# Patient Record
Sex: Female | Born: 1950
Health system: Southern US, Community
[De-identification: ages and names within clinical notes are randomized; demographics above are authoritative.]

## PROBLEM LIST (undated history)

## (undated) ENCOUNTER — Encounter

## (undated) ENCOUNTER — Telehealth

## (undated) ENCOUNTER — Encounter: Attending: Internal Medicine | Primary: Internal Medicine

## (undated) ENCOUNTER — Ambulatory Visit: Payer: MEDICARE

## (undated) ENCOUNTER — Ambulatory Visit

## (undated) ENCOUNTER — Ambulatory Visit: Attending: Pharmacist | Primary: Pharmacist

## (undated) DIAGNOSIS — E119 Type 2 diabetes mellitus without complications: Secondary | ICD-10-CM

## (undated) DIAGNOSIS — R011 Cardiac murmur, unspecified: Secondary | ICD-10-CM

## (undated) DIAGNOSIS — I10 Essential (primary) hypertension: Secondary | ICD-10-CM

## (undated) HISTORY — DX: Type 2 diabetes mellitus without complications: E11.9

## (undated) HISTORY — PX: CARDIAC SURGERY: SHX584

## (undated) HISTORY — DX: Cardiac murmur, unspecified: R01.1

## (undated) HISTORY — PX: CORONARY STENT PLACEMENT: SHX1402

## (undated) HISTORY — PX: VEIN SURGERY: SHX48

## (undated) MED ORDER — AMLODIPINE 10 MG TABLET: 10 mg | tablet | Freq: Every day | 3 refills | 90 days | Status: CN

---
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
## 2008-02-21 ENCOUNTER — Ambulatory Visit: Payer: Self-pay | Admitting: Internal Medicine

## 2009-11-18 ENCOUNTER — Ambulatory Visit: Payer: Self-pay | Admitting: Internal Medicine

## 2009-12-04 ENCOUNTER — Ambulatory Visit: Payer: Self-pay | Admitting: Internal Medicine

## 2011-04-27 ENCOUNTER — Emergency Department: Payer: Self-pay | Admitting: Emergency Medicine

## 2011-04-27 LAB — COMPREHENSIVE METABOLIC PANEL
Albumin: 3.9 g/dL (ref 3.4–5.0)
Anion Gap: 7 (ref 7–16)
Bilirubin,Total: 0.4 mg/dL (ref 0.2–1.0)
Calcium, Total: 9.6 mg/dL (ref 8.5–10.1)
Creatinine: 0.81 mg/dL (ref 0.60–1.30)
Glucose: 93 mg/dL (ref 65–99)
Osmolality: 280 (ref 275–301)
Potassium: 4.3 mmol/L (ref 3.5–5.1)
Sodium: 139 mmol/L (ref 136–145)

## 2011-04-27 LAB — TROPONIN I: Troponin-I: 0.03 ng/mL

## 2011-04-27 LAB — PROTIME-INR: INR: 0.9

## 2011-04-27 LAB — CBC
HGB: 14.7 g/dL (ref 12.0–16.0)
MCH: 31.3 pg (ref 26.0–34.0)
MCHC: 33 g/dL (ref 32.0–36.0)
MCV: 95 fL (ref 80–100)
Platelet: 180 10*3/uL (ref 150–440)
RBC: 4.7 10*6/uL (ref 3.80–5.20)

## 2011-04-27 LAB — ETHANOL: Ethanol %: <0.003 % (ref 0.000–0.080)

## 2011-04-27 LAB — APTT: Activated PTT: 25.8 secs (ref 23.6–35.9)

## 2011-04-27 LAB — CK TOTAL AND CKMB (NOT AT ARMC)
CK, Total: 65 U/L (ref 21–215)
CK-MB: 1 ng/mL (ref 0.5–3.6)

## 2011-04-27 LAB — TSH: Thyroid Stimulating Horm: 7.95 u[IU]/mL — ABNORMAL HIGH

## 2014-04-11 ENCOUNTER — Ambulatory Visit: Payer: Self-pay | Admitting: Internal Medicine

## 2014-05-07 ENCOUNTER — Ambulatory Visit: Admit: 2014-05-07 | Disposition: A | Discharge status: Home / Self Care | Payer: Self-pay | Admitting: Internal Medicine

## 2014-05-10 LAB — BRONCHIAL WASH CULTURE

## 2014-06-07 ENCOUNTER — Emergency Department
Admit: 2014-06-07 | Disposition: A | Discharge status: Home / Self Care | Payer: Self-pay | Admitting: Emergency Medicine

## 2014-06-07 LAB — BASIC METABOLIC PANEL
ANION GAP: 8 (ref 7–16)
BUN: 21 mg/dL — AB
Calcium, Total: 9.4 mg/dL
Chloride: 107 mmol/L
Co2: 28 mmol/L
Creatinine: 0.71 mg/dL
EGFR (African American): >60
GLUCOSE: 118 mg/dL — AB
Potassium: 3.8 mmol/L
Sodium: 143 mmol/L

## 2014-06-07 LAB — CBC
HCT: 43.7 % (ref 35.0–47.0)
HGB: 14.6 g/dL (ref 12.0–16.0)
MCH: 31.4 pg (ref 26.0–34.0)
MCHC: 33.4 g/dL (ref 32.0–36.0)
MCV: 94 fL (ref 80–100)
PLATELETS: 184 10*3/uL (ref 150–440)
RBC: 4.64 10*6/uL (ref 3.80–5.20)
RDW: 13.4 % (ref 11.5–14.5)
WBC: 5.3 10*3/uL (ref 3.6–11.0)

## 2014-06-07 LAB — TROPONIN I: Troponin-I: <0.03 ng/mL

## 2014-06-07 LAB — PRO B NATRIURETIC PEPTIDE: B-TYPE NATIURETIC PEPTID: 47 pg/mL

## 2015-04-14 ENCOUNTER — Telehealth: Payer: Self-pay | Admitting: Family Medicine

## 2015-04-14 NOTE — Telephone Encounter (Signed)
Okay, as long as she did not transfer to a different primary care physician.

## 2015-04-14 NOTE — Telephone Encounter (Signed)
This lady called saying you had told Ana that you would take her back on as a patient.  Her husband currently is your patient.   She wants to know if she can schedule an re-establish care appt. With you.  Her call back is [phone],  Please leave her a message if she does not answer.  Thanks, [employer]

## 2015-04-14 NOTE — Telephone Encounter (Signed)
See below please. 

## 2015-04-14 NOTE — Telephone Encounter (Signed)
Please review, i think this is alan mitchells wife maybe. i do not recall-aa

## 2015-04-21 NOTE — Telephone Encounter (Signed)
Left pt a message to call me back.  Barth Kirks

## 2015-04-22 NOTE — Telephone Encounter (Signed)
Pt returned call. I asked if pt had transferred to another PCP and she stated that she hadn't and had only gone to a walk in clinic. Pt is scheduled for her re-establish appt on 05/14/15. Thanks TNP

## 2015-05-14 ENCOUNTER — Ambulatory Visit (INDEPENDENT_AMBULATORY_CARE_PROVIDER_SITE_OTHER): Payer: 59 | Admitting: Family Medicine

## 2015-05-14 ENCOUNTER — Encounter: Payer: Self-pay | Admitting: Family Medicine

## 2015-05-14 VITALS — BP 148/80 | HR 80 | Temp 98.6°F | Resp 14 | Ht 63.0 in | Wt 154.0 lb

## 2015-05-14 DIAGNOSIS — E039 Hypothyroidism, unspecified: Secondary | ICD-10-CM | POA: Insufficient documentation

## 2015-05-14 DIAGNOSIS — Z7189 Other specified counseling: Secondary | ICD-10-CM

## 2015-05-14 DIAGNOSIS — Z87440 Personal history of urinary (tract) infections: Secondary | ICD-10-CM | POA: Insufficient documentation

## 2015-05-14 DIAGNOSIS — R030 Elevated blood-pressure reading, without diagnosis of hypertension: Secondary | ICD-10-CM | POA: Diagnosis not present

## 2015-05-14 DIAGNOSIS — E038 Other specified hypothyroidism: Secondary | ICD-10-CM

## 2015-05-14 DIAGNOSIS — Z7689 Persons encountering health services in other specified circumstances: Secondary | ICD-10-CM

## 2015-05-14 DIAGNOSIS — Z23 Encounter for immunization: Secondary | ICD-10-CM

## 2015-05-14 NOTE — Progress Notes (Signed)
Patient ID: Sheila Herrera, female   DOB: 1950/03/05, 65 y.o.   MRN: [id number]    Subjective:  HPI  Patient is here to get established. Patient has seen Dr .Dareen Piano once in 2016 for CPE and was not pleased with that exam. She also has seen Dr. Meredeth Ide in 2016 for pneumonia she had last year in 2016. Patient has not had a doctor prior to that, she has never been good about seen someone. She does want to see a gynecologist for pap and mammogram set up. She has not had colonoscopy before and we discussed option of cologuard with patient.  Patient does not have any complaints today except for mentioning that she had recurrent urinary issues-she has had 2 UTIs so far in the past 3 months. She did not see anyone for this and just took some AZO OTC and cranberry juice. Overall patient feels well and has no complaints today. Patient very infrequently seeks medical care. Prior to Admission medications   Medication Sig Start Date End Date Taking? Authorizing Provider  Ascorbic Acid (VITAMIN C) 1000 MG tablet Take 1,000 mg by mouth daily.   Yes Historical Provider, MD  Multiple Vitamins-Minerals (AIRBORNE PO) Take by mouth.   Yes Historical Provider, MD    Patient Active Problem List   Diagnosis Date Noted  . History of recurrent UTI (urinary tract infection) 05/14/2015    No past medical history on file.  Social History   Social History  . Marital Status: Married    Spouse Name: N/A  . Number of Children: N/A  . Years of Education: N/A   Occupational History  . Not on file.   Social History Main Topics  . Smoking status: Never Smoker   . Smokeless tobacco: Never Used  . Alcohol Use: No  . Drug Use: No  . Sexual Activity: Not on file   Other Topics Concern  . Not on file   Social History Narrative  . No narrative on file    No Known Allergies  Review of Systems  Constitutional: Negative.   HENT: Negative.   Eyes: Negative.   Respiratory: Negative.   Cardiovascular:  Negative.   Gastrointestinal: Negative.   Genitourinary: Positive for frequency.  Musculoskeletal: Negative.   Neurological: Negative.   Endo/Heme/Allergies: Negative.   Psychiatric/Behavioral: Negative.      There is no immunization history on file for this patient. Objective:  BP 148/80 mmHg  Pulse 80  Temp(Src) 98.6 F (37 C)  Resp 14  Ht  (1.6 m)  Wt 154 lb (69.854 kg)  BMI 27.29 kg/m2  Physical Exam  Constitutional: She is oriented to person, place, and time and well-developed, well-nourished, and in no distress.  HENT:  Head: Normocephalic and atraumatic.  Right Ear: External ear normal.  Left Ear: External ear normal.  Nose: Nose normal.  Eyes: Conjunctivae and EOM are normal.  Neck: Neck supple. No thyromegaly present.  Cardiovascular: Normal rate, regular rhythm, normal heart sounds and intact distal pulses.   No murmur heard. Pulmonary/Chest: Effort normal and breath sounds normal. No respiratory distress. She has no wheezes.  Abdominal: Soft. Bowel sounds are normal. She exhibits no distension. There is no tenderness. There is no rebound.  Musculoskeletal: Normal range of motion. She exhibits no edema or tenderness.  Lymphadenopathy:    She has no cervical adenopathy.  Neurological: She is alert and oriented to person, place, and time. Gait normal. GCS score is 15.  Skin: Skin is warm and dry.  Psychiatric: Mood, memory, affect and judgment normal.    Lab Results  Component Value Date   WBC 5.3 06/07/2014   HGB 14.6 06/07/2014   HCT 43.7 06/07/2014   PLT 184 06/07/2014   GLUCOSE 118* 06/07/2014   TSH 7.95* 04/27/2011   INR 0.9 04/27/2011    CMP     Component Value Date/Time   NA 143 06/07/2014 0534   K 3.8 06/07/2014 0534   CL 107 06/07/2014 0534   CO2 28 06/07/2014 0534   GLUCOSE 118* 06/07/2014 0534   BUN 21* 06/07/2014 0534   CREATININE 0.71 06/07/2014 0534   CALCIUM 9.4 06/07/2014 0534   PROT 7.7 04/27/2011 0951   ALBUMIN 3.9  04/27/2011 0951   AST 26 04/27/2011 0951   ALT 29 04/27/2011 0951   ALKPHOS 74 04/27/2011 0951   BILITOT 0.4 04/27/2011 0951   GFRNONAA >60 06/07/2014 0534   GFRNONAA >60 04/27/2011 0951   GFRAA >60 06/07/2014 0534   GFRAA >60 04/27/2011 0951    Assessment and Plan :  1. Encounter to establish care Patient wants to gyn for pap, mammogram, etc. Referral put in. Advised patient to get [place] copy of her last labs through work from June 2016. - Ambulatory referral to Gynecology  2. History of recurrent UTI (urinary tract infection) Discussed hygiene with patient after sexual intercourse, and keggal exercises. May need further work up. Follow as needed.Will see patient for symptom of infection  3. Other specified hypothyroidism  4. Need for Tdap vaccination - Tdap vaccine greater than or equal to 7yo IM  5. Need for zoster vaccination - Varicella-zoster vaccine subcutaneous  6. Elevated blood pressure (not hypertension) Today B/P. Advised patient to try and check her b/p at the pharmacy or home and will re access on the next visit. I think b/p up today due to 'white coat HTN." No further work up for this today.  Patient was seen and examined by Dr. Bosie Clos and note was scribed by Samara Deist, RMA. I have done the exam and reviewed the above chart and it is accurate to the best of my knowledge.    Julieanne Manson MD [facility] 05/14/2015 2:02 PM

## 2015-06-09 ENCOUNTER — Ambulatory Visit (INDEPENDENT_AMBULATORY_CARE_PROVIDER_SITE_OTHER): Payer: 59 | Admitting: Physician Assistant

## 2015-06-09 ENCOUNTER — Encounter: Payer: Self-pay | Admitting: Physician Assistant

## 2015-06-09 VITALS — BP 140/90 | HR 97 | Temp 98.5°F | Resp 16 | Wt 157.0 lb

## 2015-06-09 DIAGNOSIS — H10023 Other mucopurulent conjunctivitis, bilateral: Secondary | ICD-10-CM | POA: Diagnosis not present

## 2015-06-09 MED ORDER — NEOMYCIN-POLYMYXIN-HC 3.5-10000-1 OP SUSP: 3.0000 [drp] | Freq: Four times a day (QID) | OPHTHALMIC | Status: DC

## 2015-06-09 NOTE — Patient Instructions (Signed)

## 2015-06-09 NOTE — Progress Notes (Signed)
Patient: Sheila Herrera Female    DOB: 1950-10-20   65 y.o.   MRN: [id number] Visit Date: 06/09/2015  Today's Provider: Margaretann Loveless, PA-C   Chief Complaint  Patient presents with  . Conjunctivitis   Subjective:    Conjunctivitis  The current episode started today. The onset was sudden. The problem has been gradually worsening. Nothing relieves the symptoms. Nothing aggravates the symptoms. Associated symptoms include eye itching, eye discharge (purulent) and eye redness. Pertinent negatives include no fever, no decreased vision, no double vision, no abdominal pain, no constipation, no diarrhea, no nausea, no vomiting, no congestion, no ear discharge, no ear pain, no headaches, no rhinorrhea, no sore throat, no muscle aches, no cough, no URI, no wheezing and no eye pain. Both eyes are affected. Symptoms onset today at 11am suddenly. Her granddaughters had pink eye last week [place]. She does have purulent drainage.     No Known Allergies Previous Medications   ASCORBIC ACID (VITAMIN C) 1000 MG TABLET    Take 1,000 mg by mouth daily.   MULTIPLE VITAMINS-MINERALS (AIRBORNE PO)    Take by mouth.    Review of Systems  Constitutional: Negative for fever and chills.  HENT: Negative for congestion, ear discharge, ear pain, postnasal drip, rhinorrhea, sinus pressure and sore throat.   Eyes: Positive for discharge (purulent), redness and itching. Negative for double vision and pain.  Respiratory: Negative for cough, chest tightness, shortness of breath and wheezing.   Cardiovascular: Negative for chest pain, palpitations and leg swelling.  Gastrointestinal: Negative for nausea, vomiting, abdominal pain, diarrhea and constipation.  Neurological: Negative for headaches.    Social History  Substance Use Topics  . Smoking status: Never Smoker   . Smokeless tobacco: Never Used  . Alcohol Use: No   Objective:   BP 140/90 mmHg   Pulse 97  Temp(Src) 98.5 F (36.9 C) (Oral)  Resp 16  Wt 157 lb (71.215 kg)  Physical Exam  Constitutional: She appears well-developed and well-nourished. No distress.  HENT:  Head: Normocephalic and atraumatic.  Right Ear: Hearing, tympanic membrane, external ear and ear canal normal.  Left Ear: Hearing, tympanic membrane, external ear and ear canal normal.  Nose: Nose normal. Right sinus exhibits no maxillary sinus tenderness and no frontal sinus tenderness. Left sinus exhibits no maxillary sinus tenderness and no frontal sinus tenderness.  Mouth/Throat: Uvula is midline, oropharynx is clear and moist and mucous membranes are normal. No oropharyngeal exudate, posterior oropharyngeal edema or posterior oropharyngeal erythema.  Eyes: EOM are normal. Pupils are equal, round, and reactive to light. Right eye exhibits discharge (yellow-green purulent). Right eye exhibits no chemosis. Left eye exhibits discharge (yellow-green purulent). Left eye exhibits no chemosis. Right conjunctiva is injected. Right conjunctiva has no hemorrhage. Left conjunctiva is injected. Left conjunctiva has no hemorrhage. No scleral icterus.  Neck: Normal range of motion. Neck supple. No tracheal deviation present. No thyromegaly present.  Cardiovascular: Normal rate, regular rhythm and normal heart sounds.  Exam reveals no gallop and no friction rub.   No murmur heard. Pulmonary/Chest: Effort normal and breath sounds normal. No stridor. No respiratory distress. She has no wheezes. She has no rales.  Lymphadenopathy:    She has no cervical adenopathy.  Skin: Skin is warm and dry. She is not diaphoretic.  Vitals reviewed.       Assessment & Plan:     1. Acute bacterial conjunctivitis, bilateral  Bacterial conjunctivitis with acute onset and purulent drainage. Will treat with cortisporin as below. Advised to practice good hand hygiene. Wipe eyes with disposable tissue from inner corner to outer corner and throw away.  Warm compresses for swelling and discomfort. Call if symptoms worsen or fail to improve.  - neomycin-polymyxin-hydrocortisone (CORTISPORIN) 3.5-10000-1 ophthalmic suspension; Place 3 drops into both eyes 4 (four) times daily.  Dispense: 7.5 mL; Refill: 0       Margaretann Loveless, PA-C  [facility]

## 2015-07-29 ENCOUNTER — Encounter: Payer: 59 | Admitting: Obstetrics and Gynecology

## 2016-01-20 DIAGNOSIS — R918 Other nonspecific abnormal finding of lung field: Secondary | ICD-10-CM | POA: Insufficient documentation

## 2016-01-20 DIAGNOSIS — I214 Non-ST elevation (NSTEMI) myocardial infarction: Secondary | ICD-10-CM | POA: Insufficient documentation

## 2016-01-21 ENCOUNTER — Encounter: Payer: Self-pay | Admitting: Family Medicine

## 2016-01-23 ENCOUNTER — Telehealth: Payer: Self-pay

## 2016-01-23 ENCOUNTER — Encounter: Payer: Self-pay | Admitting: Physician Assistant

## 2016-01-23 NOTE — Telephone Encounter (Signed)
Advised patient as below.  

## 2016-01-23 NOTE — Telephone Encounter (Signed)
Patient's husband called saying that patient went to [facility] ED for chest pain on 01/20/16, and a stent was placed the following day. He reports that they took her out of work all this week. He feels that the patient may need an additional week to recover. He reports that the doctor's at the ED told him to contact her PCP if she feels she needs more time away from work. Will you be willing to write a note for the patient for her to stay out next week? Patient has an appt scheduled with you at the end of the month. Please advise. Contact number is [phone]. Thanks!

## 2016-01-23 NOTE — Telephone Encounter (Signed)
Work note printed.  

## 2016-02-05 ENCOUNTER — Ambulatory Visit (INDEPENDENT_AMBULATORY_CARE_PROVIDER_SITE_OTHER): Payer: 59 | Admitting: Physician Assistant

## 2016-02-05 ENCOUNTER — Other Ambulatory Visit: Payer: Self-pay

## 2016-02-05 ENCOUNTER — Encounter: Payer: Self-pay | Admitting: Physician Assistant

## 2016-02-05 VITALS — BP 140/80 | HR 83 | Temp 97.7°F | Resp 16 | Wt 156.2 lb

## 2016-02-05 DIAGNOSIS — E039 Hypothyroidism, unspecified: Secondary | ICD-10-CM | POA: Diagnosis not present

## 2016-02-05 DIAGNOSIS — R911 Solitary pulmonary nodule: Secondary | ICD-10-CM | POA: Diagnosis not present

## 2016-02-05 DIAGNOSIS — I219 Acute myocardial infarction, unspecified: Secondary | ICD-10-CM | POA: Diagnosis not present

## 2016-02-05 DIAGNOSIS — I2102 ST elevation (STEMI) myocardial infarction involving left anterior descending coronary artery: Secondary | ICD-10-CM

## 2016-02-05 NOTE — Progress Notes (Signed)
Patient: Sheila Herrera Female    DOB: 1950-11-22   65 y.o.   MRN: [id number] Visit Date: 02/05/2016  Today's Provider: Margaretann Loveless, PA-C   Chief Complaint  Patient presents with  . Hospitalization Follow-up   Subjective:    HPI  Follow up Hospitalization  Patient was admitted to [facility] on 01/20/16 and discharged on 01/21/16. She was treated for NSTEMI. Treatment for this included stent was placed. She reports excellent compliance with treatment. She reports this condition is Improved. Also the CXR showed Opacity of lung. She has a follow-up appointment scheduled 02/18/16 at [facility] with the Cardiologist. She went back to work yesterday. She is planning to retire on 04/05/16. Also of note her TSH was found to be elevated. She still denies any dry skin/hair, brittle nails, weight changes, constipation or hot/cold intolerance. She does have fatigue, but none prior to MI. She also thinks the fatigue is from the metoprolol and atorvastatin. She is now taking the atorvastatin at night and reports symptoms are improving.  Also lung nodule noted on CXR in right middle lung, seemingly unchanged from chest CT from 04/2014. She was seen by pulmonology in 2016 and had bronchoscopy. Biopsy had only shown persistent pneumonia per patient.  ------------------------------------------------------------------------------------    No Known Allergies   Current Outpatient Prescriptions:  .  aspirin 81 MG chewable tablet, Chew 81 mg by mouth., Disp: , Rfl:  .  atorvastatin (LIPITOR) 80 MG tablet, Take 80 mg by mouth., Disp: , Rfl:  .  metoprolol succinate (TOPROL-XL) 25 MG 24 hr tablet, Take 12.5 mg by mouth., Disp: , Rfl:  .  prasugrel (EFFIENT) 10 MG TABS tablet, Take 10 mg by mouth., Disp: , Rfl:  .  nitroGLYCERIN (NITROSTAT) 0.4 MG SL tablet, Place 0.4 mg under the tongue., Disp: , Rfl:   Review of Systems  Constitutional: Positive for fatigue ( a little).  HENT: Negative.     Respiratory: Negative for cough, chest tightness, shortness of breath and wheezing.   Cardiovascular: Negative for chest pain, palpitations and leg swelling.  Gastrointestinal: Negative for abdominal pain.  Musculoskeletal: Negative.   Neurological: Negative.     Social History  Substance Use Topics  . Smoking status: Never Smoker  . Smokeless tobacco: Never Used  . Alcohol use No   Objective:   BP 140/80 (BP Location: Left Arm, Patient Position: Sitting, Cuff Size: Normal)   Pulse 83   Temp 97.7 F (36.5 C) (Oral)   Resp 16   Wt 156 lb 3.2 oz (70.9 kg)   BMI 27.67 kg/m   Physical Exam  Constitutional: She appears well-developed and well-nourished. No distress.  Neck: Normal range of motion. Neck supple. No JVD present. No tracheal deviation present. No thyromegaly present.  Cardiovascular: Normal rate, regular rhythm, normal heart sounds and intact distal pulses.  Exam reveals no gallop and no friction rub.   No murmur heard. Pulmonary/Chest: Effort normal and breath sounds normal. No respiratory distress. She has no wheezes. She has no rales.  Musculoskeletal: She exhibits no edema.  Lymphadenopathy:    She has no cervical adenopathy.  Skin: She is not diaphoretic.  Vitals reviewed.     Assessment & Plan:     1. Myocardial infarction involving left anterior descending (LAD) coronary artery (HCC) Improving. Follows up with cardiology on 02/18/16. Will most likely establish with a cardiologist locally following initial follow up. Continue atorvastatin, ASA, effient, and metoprolol as directed.  - CBC w/Diff/Platelet -  Comprehensive Metabolic Panel (CMET)  2. Lung nodule Previously noted on Chest CT in 04/2014 and again on most recent CXR from 01/20/16. Will get Chest CT for further work up and check for stability. - CT CHEST LIMITED W CONTRAST; Future  3. Hypothyroidism, unspecified type Previous elevation of TSH at 7. No symptoms. No history. Possible stress related.  Will check labs as below and f/u pending results.  - Thyroid Panel With TSH  4. Myocardial infarction less than 4 weeks ago See above medical treatment plan for #1. - CBC w/Diff/Platelet - Comprehensive Metabolic Panel (CMET)       Margaretann LovelessJennifer M Burnette, PA-C  [facility]

## 2016-02-05 NOTE — Patient Instructions (Signed)
Heart Attack A heart attack (myocardial infarction, MI) causes damage to the heart that cannot be fixed. A heart attack often happens when a blood clot or other blockage cuts blood flow to the heart. When this happens, certain areas of the heart begin to die. This causes the pain you feel during a heart attack. Follow these instructions at home:  Take medicine as told by your doctor. You may need medicine to: ? Keep your blood from clotting too easily. ? Control your blood pressure. ? Lower your cholesterol. ? Control abnormal heart rhythms.  Change certain behaviors as told by your doctor. This may include: ? Quitting smoking. ? Being active. ? Eating a heart-healthy diet. Ask your doctor for help with this diet. ? Keeping a healthy weight. ? Keeping your diabetes under control. ? Lessening stress. ? Limiting how much alcohol you drink. Do not take these medicines unless your doctor says that you can:  Nonsteroidal anti-inflammatory drugs (NSAIDs). These include: ? Ibuprofen. ? Naproxen. ? Celecoxib.  Vitamin supplements that have vitamin A, vitamin E, or both.  Hormone therapy that contains estrogen with or without progestin.  Get help right away if:  You have sudden chest discomfort.  You have sudden discomfort in your: ? Arms. ? Back. ? Neck. ? Jaw.  You have shortness of breath at any time.  You have sudden sweating or clammy skin.  You feel sick to your stomach (nauseous) or throw up (vomit).  You suddenly get light-headed or dizzy.  You feel your heart beating fast or skipping beats. These symptoms may be an emergency. Do not wait to see if the symptoms will go away. Get medical help right away. Call your local emergency services (911 in the [place] Do not drive yourself to the hospital. This information is not intended to replace advice given to you by your health care provider. Make sure you discuss any questions you have with your health care  provider. Document Released: 07/27/2011 Document Revised: 07/03/2015 Document Reviewed: 03/30/2013 Elsevier Interactive Patient Education  2017 Elsevier Inc.  

## 2016-02-06 LAB — CBC WITH DIFFERENTIAL/PLATELET
BASOS ABS: 0 10*3/uL (ref 0.0–0.2)
Basos: 1 %
EOS (ABSOLUTE): 0.1 10*3/uL (ref 0.0–0.4)
Eos: 3 %
Hematocrit: 39.5 % (ref 34.0–46.6)
Hemoglobin: 13.2 g/dL (ref 11.1–15.9)
IMMATURE GRANULOCYTES: 0 %
Immature Grans (Abs): 0 10*3/uL (ref 0.0–0.1)
Lymphocytes Absolute: 1.5 10*3/uL (ref 0.7–3.1)
Lymphs: 30 %
MCH: 31.4 pg (ref 26.6–33.0)
MCHC: 33.4 g/dL (ref 31.5–35.7)
MCV: 94 fL (ref 79–97)
MONOS ABS: 0.4 10*3/uL (ref 0.1–0.9)
Monocytes: 8 %
NEUTROS PCT: 58 %
Neutrophils Absolute: 2.9 10*3/uL (ref 1.4–7.0)
PLATELETS: 298 10*3/uL (ref 150–379)
RBC: 4.21 x10E6/uL (ref 3.77–5.28)
RDW: 13.5 % (ref 12.3–15.4)
WBC: 4.9 10*3/uL (ref 3.4–10.8)

## 2016-02-06 LAB — COMPREHENSIVE METABOLIC PANEL
ALT: 19 IU/L (ref 0–32)
AST: 18 IU/L (ref 0–40)
Albumin/Globulin Ratio: 1.5 (ref 1.2–2.2)
Albumin: 4.1 g/dL (ref 3.6–4.8)
Alkaline Phosphatase: 118 IU/L — ABNORMAL HIGH (ref 39–117)
BILIRUBIN TOTAL: 0.5 mg/dL (ref 0.0–1.2)
BUN/Creatinine Ratio: 17 (ref 12–28)
BUN: 14 mg/dL (ref 8–27)
CO2: 24 mmol/L (ref 18–29)
Calcium: 9.1 mg/dL (ref 8.7–10.3)
Chloride: 103 mmol/L (ref 96–106)
Creatinine, Ser: 0.82 mg/dL (ref 0.57–1.00)
GFR calc non Af Amer: 76 mL/min/{1.73_m2} (ref >59)
GFR, EST AFRICAN AMERICAN: 88 mL/min/{1.73_m2} (ref >59)
GLUCOSE: 98 mg/dL (ref 65–99)
Globulin, Total: 2.7 g/dL (ref 1.5–4.5)
POTASSIUM: 4.4 mmol/L (ref 3.5–5.2)
Sodium: 141 mmol/L (ref 134–144)
TOTAL PROTEIN: 6.8 g/dL (ref 6.0–8.5)

## 2016-02-06 LAB — THYROID PANEL WITH TSH
FREE THYROXINE INDEX: 1.7 (ref 1.2–4.9)
T3 UPTAKE RATIO: 23 % — AB (ref 24–39)
T4, Total: 7.4 ug/dL (ref 4.5–12.0)
TSH: 4.33 u[IU]/mL (ref 0.450–4.500)

## 2016-02-10 ENCOUNTER — Telehealth: Payer: Self-pay | Admitting: Physician Assistant

## 2016-02-10 DIAGNOSIS — R911 Solitary pulmonary nodule: Secondary | ICD-10-CM

## 2016-02-10 NOTE — Telephone Encounter (Signed)
Per [facility] order for CT is incorrect.Use IMG202,Thanks

## 2016-02-11 NOTE — Telephone Encounter (Signed)
CT order corrected

## 2016-02-13 ENCOUNTER — Ambulatory Visit: Admission: RE | Admit: 2016-02-13 | Payer: 59 | Source: Ambulatory Visit

## 2016-02-16 ENCOUNTER — Ambulatory Visit: Admission: RE | Admit: 2016-02-16 | Payer: 59 | Source: Ambulatory Visit

## 2016-06-10 ENCOUNTER — Ambulatory Visit (INDEPENDENT_AMBULATORY_CARE_PROVIDER_SITE_OTHER): Payer: 59 | Admitting: Physician Assistant

## 2016-06-10 ENCOUNTER — Encounter: Payer: Self-pay | Admitting: Physician Assistant

## 2016-06-10 VITALS — BP 120/70 | HR 84 | Temp 98.3°F | Resp 16 | Wt 163.8 lb

## 2016-06-10 DIAGNOSIS — N390 Urinary tract infection, site not specified: Secondary | ICD-10-CM

## 2016-06-10 LAB — POCT URINALYSIS DIPSTICK
Bilirubin, UA: NEGATIVE
Blood, UA: NEGATIVE
Glucose, UA: NEGATIVE
Ketones, UA: NEGATIVE
Leukocytes, UA: NEGATIVE
NITRITE UA: POSITIVE
PH UA: 6 (ref 5.0–8.0)
PROTEIN UA: NEGATIVE
SPEC GRAV UA: 1.025 (ref 1.010–1.025)
Urobilinogen, UA: 0.2 E.U./dL

## 2016-06-10 MED ORDER — SULFAMETHOXAZOLE-TRIMETHOPRIM 800-160 MG PO TABS: 1.0000 | ORAL_TABLET | Freq: Two times a day (BID) | ORAL | 0 refills | Status: DC

## 2016-06-10 NOTE — Patient Instructions (Signed)

## 2016-06-10 NOTE — Progress Notes (Signed)
Patient: Sheila Herrera Female    DOB: 1950-09-19   66 y.o.   MRN: [id number] Visit Date: 06/10/2016  Today's Provider: Margaretann Loveless, PA-C   Chief Complaint  Patient presents with  . Urinary Tract Infection   Subjective:    Urinary Tract Infection   This is a new problem. The current episode started 1 to 4 weeks ago (2 weeks ago). The problem occurs every urination. The problem has been gradually worsening. The quality of the pain is described as burning and stabbing. The pain is at a severity of 7/10. The pain is moderate. There has been no fever. She is sexually active. There is no history of pyelonephritis. Associated symptoms include frequency and urgency. Pertinent negatives include no chills, discharge, flank pain or hematuria. She has tried increased fluids (OTC Uristiat) for the symptoms. Her past medical history is significant for recurrent UTIs.      No Known Allergies   Current Outpatient Prescriptions:  .  aspirin 81 MG chewable tablet, Chew 81 mg by mouth., Disp: , Rfl:  .  metoprolol succinate (TOPROL-XL) 25 MG 24 hr tablet, Take 12.5 mg by mouth., Disp: , Rfl:  .  prasugrel (EFFIENT) 10 MG TABS tablet, Take 10 mg by mouth., Disp: , Rfl:  .  rosuvastatin (CRESTOR) 5 MG tablet, Take 5 mg by mouth., Disp: , Rfl:  .  nitroGLYCERIN (NITROSTAT) 0.4 MG SL tablet, Place 0.4 mg under the tongue., Disp: , Rfl:   Review of Systems  Constitutional: Positive for fatigue. Negative for chills and fever.  Respiratory: Negative for cough, chest tightness and shortness of breath.   Cardiovascular: Negative for chest pain, palpitations and leg swelling.  Gastrointestinal: Negative for abdominal pain and constipation.  Genitourinary: Positive for dysuria, frequency and urgency. Negative for flank pain and hematuria.  Musculoskeletal: Negative for back pain.    Social History  Substance Use Topics  . Smoking status: Never Smoker  . Smokeless tobacco: Never Used  .  Alcohol use No   Objective:   BP 120/70 (BP Location: Right Arm, Patient Position: Sitting, Cuff Size: Normal)   Pulse 84   Temp 98.3 F (36.8 C) (Oral)   Resp 16   Wt 163 lb 12.8 oz (74.3 kg)   BMI 29.02 kg/m    Physical Exam  Constitutional: She is oriented to person, place, and time. She appears well-developed and well-nourished. No distress.  Cardiovascular: Normal rate, regular rhythm and normal heart sounds.  Exam reveals no gallop and no friction rub.   No murmur heard. Pulmonary/Chest: Effort normal and breath sounds normal. No respiratory distress. She has no wheezes. She has no rales.  Abdominal: Soft. Normal appearance and bowel sounds are normal. She exhibits no distension and no mass. There is no hepatosplenomegaly. There is no tenderness. There is no rebound, no guarding and no CVA tenderness.  Neurological: She is alert and oriented to person, place, and time.  Skin: Skin is warm and dry. She is not diaphoretic.        Assessment & Plan:     1. Urinary tract infection without hematuria, site unspecified Patient brought in her own urine from home since she has difficulty urinating in public. UA was positive for Nitrites. Will treat with Bactrim as below. She is to continue to push fluids and may use AZO tabs for bladder spasm. She is to call if symptoms worsen. Urine culture not sent due to urine not being in sterile container  from home.  - POCT urinalysis dipstick - sulfamethoxazole-trimethoprim (BACTRIM DS,SEPTRA DS) 800-160 MG tablet; Take 1 tablet by mouth 2 (two) times daily.  Dispense: 20 tablet; Refill: 0       Margaretann Loveless, PA-C  [facility]

## 2016-10-21 ENCOUNTER — Ambulatory Visit
Admission: RE | Admit: 2016-10-21 | Discharge: 2016-10-21 | Disposition: A | Discharge status: Home / Self Care | Payer: Commercial Managed Care - PPO

## 2016-10-21 DIAGNOSIS — I240 Acute coronary thrombosis not resulting in myocardial infarction: Principal | ICD-10-CM

## 2016-10-22 MED ORDER — ROSUVASTATIN 10 MG TABLET
ORAL_TABLET | Freq: Every day | ORAL | 5 refills | 0.00000 days | Status: CP
Start: 2016-10-22 — End: 2017-03-24

## 2016-12-20 ENCOUNTER — Emergency Department
Admission: EM | Admit: 2016-12-20 | Discharge: 2016-12-20 | Disposition: A | Discharge status: Home / Self Care | Source: Intra-hospital

## 2016-12-20 ENCOUNTER — Emergency Department
Admission: EM | Admit: 2016-12-20 | Discharge: 2016-12-20 | Disposition: A | Discharge status: Home / Self Care | Source: Intra-hospital | Admitting: Emergency Medicine

## 2016-12-20 DIAGNOSIS — R079 Chest pain, unspecified: Principal | ICD-10-CM

## 2017-03-18 MED ORDER — PRASUGREL 10 MG TABLET
ORAL_TABLET | Freq: Every day | ORAL | 0 refills | 0 days | Status: CP
Start: 2017-03-18 — End: 2017-03-24

## 2017-03-24 ENCOUNTER — Ambulatory Visit: Admit: 2017-03-24 | Discharge: 2017-03-25 | Payer: MEDICARE

## 2017-03-24 DIAGNOSIS — I240 Acute coronary thrombosis not resulting in myocardial infarction: Principal | ICD-10-CM

## 2017-03-24 DIAGNOSIS — E785 Hyperlipidemia, unspecified: Secondary | ICD-10-CM

## 2017-03-24 MED ORDER — EZETIMIBE 10 MG TABLET
ORAL_TABLET | Freq: Every day | ORAL | 3 refills | 0 days | Status: CP
Start: 2017-03-24 — End: 2017-06-23

## 2017-03-24 MED ORDER — ROSUVASTATIN 5 MG TABLET
ORAL_TABLET | Freq: Every day | ORAL | 3 refills | 0.00000 days | Status: CP
Start: 2017-03-24 — End: 2017-06-23

## 2017-03-25 MED ORDER — METOPROLOL SUCCINATE ER 25 MG TABLET,EXTENDED RELEASE 24 HR
ORAL_TABLET | Freq: Every day | ORAL | 2 refills | 0 days | Status: CP
Start: 2017-03-25 — End: 2017-06-23

## 2017-06-23 ENCOUNTER — Ambulatory Visit: Admit: 2017-06-23 | Discharge: 2017-06-24 | Payer: MEDICARE

## 2017-06-23 DIAGNOSIS — I240 Acute coronary thrombosis not resulting in myocardial infarction: Principal | ICD-10-CM

## 2017-06-23 DIAGNOSIS — E785 Hyperlipidemia, unspecified: Secondary | ICD-10-CM

## 2017-06-23 DIAGNOSIS — I214 Non-ST elevation (NSTEMI) myocardial infarction: Secondary | ICD-10-CM

## 2017-12-26 MED ORDER — METOPROLOL SUCCINATE ER 25 MG TABLET,EXTENDED RELEASE 24 HR
ORAL_TABLET | Freq: Every day | ORAL | 1 refills | 0 days | Status: CP
Start: 2017-12-26 — End: 2017-12-29

## 2017-12-29 ENCOUNTER — Ambulatory Visit: Admit: 2017-12-29 | Discharge: 2017-12-29 | Payer: MEDICARE

## 2017-12-29 DIAGNOSIS — I240 Acute coronary thrombosis not resulting in myocardial infarction: Principal | ICD-10-CM

## 2017-12-29 MED ORDER — ROSUVASTATIN 5 MG TABLET
ORAL_TABLET | 6 refills | 0 days | Status: CP
Start: 2017-12-29 — End: 2017-12-30

## 2017-12-29 MED ORDER — METOPROLOL SUCCINATE ER 25 MG TABLET,EXTENDED RELEASE 24 HR
ORAL_TABLET | Freq: Every day | ORAL | 3 refills | 0.00000 days | Status: CP
Start: 2017-12-29 — End: 2017-12-29

## 2017-12-29 MED ORDER — METOPROLOL SUCCINATE ER 25 MG TABLET,EXTENDED RELEASE 24 HR: 25 mg | tablet | Freq: Every day | 3 refills | 0 days | Status: AC

## 2017-12-30 MED ORDER — ROSUVASTATIN 5 MG TABLET
ORAL_TABLET | Freq: Every day | ORAL | 1 refills | 0 days | Status: CP
Start: 2017-12-30 — End: 2018-06-22

## 2018-06-21 NOTE — Unmapped (Signed)
Patient called in preparation for phone visit with Dr.     Time spent on visit: 5 minutes    Allergies reviewed : yes    Pharmacy verified: yes    Medications reviewed: yes    Refills: 0    Screening completed: C. Marquies Wanat LPN    Questions/concerns addressed: Says that she doesn't have the stamina that she used to have.     Doximity test: Doximity ready.    Verbal General Consent: yes

## 2018-06-22 ENCOUNTER — Telehealth: Admit: 2018-06-22 | Discharge: 2018-06-23 | Payer: MEDICARE

## 2018-06-22 DIAGNOSIS — E785 Hyperlipidemia, unspecified: Secondary | ICD-10-CM

## 2018-06-22 DIAGNOSIS — I240 Acute coronary thrombosis not resulting in myocardial infarction: Principal | ICD-10-CM

## 2018-06-22 MED ORDER — REPATHA SURECLICK 140 MG/ML SUBCUTANEOUS PEN INJECTOR
SUBCUTANEOUS | 11 refills | 0 days | Status: CP
Start: 2018-06-22 — End: ?

## 2018-06-22 NOTE — Unmapped (Signed)
06/22/2018    PRIMARY CARE PROVIDER:  Bosie Clos, MD  [address]  [place] [address]    REASON FOR VISIT:  Follow-up CAD non-STEMI    I spent 23 minutes on the audio/video with the patient. I spent an additional 15 minutes on pre- and post-visit activities.     The patient was physically located in [place] or a state in which I am permitted to provide care. The patient and/or parent/gauardian understood that s/he may incur co-pays and cost sharing, and agreed to the telemedicine visit. The visit was completed via phone and/or video, which was appropriate and reasonable under the circumstances given the patient's presentation at the time.    The patient and/or parent/guardian has been advised of the potential risks and limitations of this mode of treatment (including, but not limited to, the absence of in-person examination) and has agreed to be treated using telemedicine. The patient's/patient's family's questions regarding telemedicine have been answered.     If the phone/video visit was completed in an ambulatory setting, the patient and/or parent/guardian has also been advised to contact their provider???s office for worsening conditions, and seek emergency medical treatment and/or call 911 if the patient deems either necessary.    ASSESSMENT AND PLAN:     1. Ischemic heart disease due to coronary artery obstruction (RAF-HCC)  She is not having any recurrent symptoms suggestive of ischemia.   Single-vessel disease with LAD stent on 01/20/16.  She has completed 12 months of DAPT. Now on aspirin    She retired from [employer]    She did not participate in cardiac rehabilitation but walks 2-3 miles 6 days per week and is not having any symptoms suggestive of ischemia.    --She denies any CP, SOB, dizziness.   --She has been able to walk two miles a day without any difficulty    2. Blood pressure  Her blood pressure has tended to run on the low side and she is tolerating metoprolol succinate but she has noted to elevated pressures at home 135/75 and 144/82.  She has not been checking her blood pressure as regularly so have asked her to monitor at home on a daily basis and send me the results over the next month..  Continue metoprolol to 25 mg once daily consider adding additional therapy if blood pressure sustains above 130 systolic    3. Lipids   After her non-STEMI in 2017 she attempted to take high-dose atorvastatin 80 mg.  She was unable to tolerate atorvastatin due to body aches and pains despite trying very low doses and alternating days.  We also tried rosuvastatin at low dose of 2.5 mg and she has been unable to tolerate due to muscle aches and pains.  She stated she was unable to climb the steps after taking rosuvastatin for the last time.  We also had her try rosuvastatin once per week and she was unable to tolerate even this small dose.  We had discussed PCSK9 inhibitors in the past but attempted to have her try statins 1 more time.  She has not tolerated statins on multiple attempts.  With her history of coronary artery disease, positive family history of early coronary artery disease she would be a good candidate for Repatha.  [facility].  We can follow-up her labs after she starts 2 doses.     Lab Results   Component Value Date    LDL 121 (  H) 10/21/2016      Return in about 6 months (around 12/23/2018).    No future appointments.    HISTORY OF PRESENT ILLNESS:  Denise Mcpherson is a 68 y.o. female with a past medical history of likely septal defect repair in 1958 he presented to the [facility] emergency Department on 01/20/16 with chest pain. The day prior to her presentation she experienced substernal aching chest pain with radiation to her left arm. The pain was worse with activity improved with rest she also felt nauseous. She called EMS and was taken to the ED where she received aspirin and nitroglycerin. She has a strong family history of coronary disease but no other major medical problems. She underwent left heart catheterization on 01/20/16 that showed significant 1 vessel coronary artery disease with PCI performed to a 99% LAD lesion with drug-eluting stent placement. She had excellent results. Her echocardiogram showed a normal LVEF of 60-65%, aortic sclerosis, and normal RV function. She was discharged on appropriate medications including aspirin 81 mg daily, and Effient 10 mg once daily. She was referred for cardiac rehabilitation. Since discharge she denies any recurrent chest pain, pressure, or tightness. She is not describing any symptoms consistent with volume overload including shortness of breath, lower extremity edema, orthopnea, or cough. She does have family history of coronary disease in 2 siblings and both parents. She was unable to tolerate atorvastatin but we started low-dose rosuvastatin at our last visit we attempted to increase but she did not tolerate.  She stopped taking rosuvastatin and was unable to afford Zetia.  She had no interval change in her symptoms since her last visit.  She attempted to start simvastatin once per week but was unable to tolerate.  Overall she has been doing well from a cardiac standpoint since her last visit.  She is had no chest pain, pressure, or tightness.  She has no symptoms of volume overload or arrhythmia.  She has found quarantine difficult and has noted more depression but was able to go to the beach last week and feels a bit better.    PAST MEDICAL HISTORY:  Likely septal defect repair 1958  Coronary artery disease status post DES to the LAD on 01/20/16    REVIEW OF SYSTEMS: 10 system review is negative X 10 except as stated in the HPI.     CURRENT MEDICATIONS:  Current Outpatient Medications   Medication Sig Dispense Refill   ??? amoxicillin (AMOXIL) 500 MG capsule Take 500 mg by mouth once as needed. Used prior to dental visit  1   ??? aspirin 81 MG chewable tablet Chew 1 tablet (81 mg total) daily. 30 tablet 11   ??? metoprolol succinate (TOPROL-XL) 25 MG 24 hr tablet Take 1 tablet (25 mg total) by mouth daily. 90 tablet 3   ??? evolocumab (REPATHA SURECLICK) 140 mg/mL PnIj Inject 140 mg under the skin every fourteen (14) days. 2 Syringe 11     No current facility-administered medications for this visit.        ALLERGIES:  No Known Allergies    PHYSICAL EXAMINATION: unchanged   VITAL SIGNS: There were no vitals taken for this visit.  Video visit no in person exam performed.  Documented exam below is from last in person visit.  Wt Readings from Last 3 Encounters:   12/29/17 75.8 kg (167 lb 3.2 oz)   06/23/17 74.6 kg (164 lb 6.4 oz)   03/24/17 74.6 kg (164 lb 8 oz)     GENERAL:  The patient is alert and cooperative and is in no acute distress.   RESPIRATORY:  Respirations are even and unlabored.  Clear to auscultation bilaterally.  No crackles, wheezes or rhonchi.   CARDIOVASCULAR: Regular rate and rhythm, normal S1, S2, no rubs or gallops are noted. No edema. Radial pulses, 2+ and equal.   MSK:  Grossly normal.   NEUROLOGICAL:  Alert and oriented x 3.  No focal neurological deficits.   SKIN:  Warm, dry and intact.    PSYCHIATRIC:  Mood and affect are pleasant and appropriate.     DIAGNOSTIC AND LAB RESULTS:   Lab Results   Component Value Date    Sodium 139 12/20/2016    Potassium 4.7 12/20/2016    Chloride 104 12/20/2016    CO2 22.0 12/20/2016    BUN 18 12/20/2016    Creatinine 0.74 12/20/2016    Creatinine 0.75 01/21/2016    Creatinine 0.79 01/20/2016    Glucose 106 12/20/2016    Calcium 9.6 12/20/2016    Albumin 4.2 12/20/2016    Total Protein 7.6 12/20/2016    Total Bilirubin 0.9 12/20/2016    AST 27 12/20/2016    ALT 28 12/20/2016    Alkaline Phosphatase 106 12/20/2016    Magnesium 1.9 01/21/2016    Cholesterol 192 10/21/2016    Triglycerides 105 10/21/2016    HDL 50 10/21/2016    HDL 65 (H) 01/20/2016    LDL Calculated 121 (H) 10/21/2016    LDL Calculated 134 (H) 01/20/2016    Hemoglobin A1C 5.8 01/20/2016    WBC 7.7 12/20/2016    HGB 14.3 12/20/2016    HCT 43.1 12/20/2016    MCV 92.7 12/20/2016    Platelet 231 12/20/2016        Cardiovascular History & Procedures:  Cardiovascular Problems:  ?? CAD, DES to LAD 01/20/16    Risk Factors:  ?? Age, gender, family history     Cath / PCI:  01/20/16: Findings: Coronary artery disease (as described below) including a mid LAD 99% stenosis and a distal LAD 40% stenosis. Successful PCI to the mid LAD with the placement of a Synergy drug-eluting stent with excellent angiographic result and TIMI 3 flow.    CV Surgery:  ?? 1969 - septal repair?    EP Procedures and Devices:  ?? none    Non-Invasive Evaluation(s):  ?? TTE 01/20/16: Normal left ventricular systolic function, ejection fraction 60 to 65%, Aortic sclerosis, Normal right ventricular systolic function

## 2018-06-22 NOTE — Unmapped (Signed)
Continue to monitor blood pressure at home  Will obtain labs this summer once restrictions have been lifted  We will start Repatha.  You will be contacted by [facility].

## 2018-06-22 NOTE — Unmapped (Signed)
Per test claim for REPATHA at the [facility], patient needs Medication Assistance Program for Prior Authorization.

## 2018-06-26 NOTE — Unmapped (Signed)
07/10/18 - Denise Mcpherson has decided she does not want to use a PCSK9 at this time. She expressed understanding that she could contact us if she changes her mind. Routing to provider and disenrolling at this time.

## 2018-06-26 NOTE — Unmapped (Signed)
[facility] Medication Referral: PA and Financial Assistance Approved      Medication (Brand/Generic): REPATHA    Final Test Claim completed with resulted information below:    Patient ABLE to fill at [facility]  Insurance Company:  [facility] AETNA PART D  Anticipated Copay: $0  Is anticipated copay with a copay card or grant? Yes, there was a grant approved for this patient for this medication.     Does patient's insurance plan only allow a 15 day supply for the first 6 fills in the [facility] Program? NO  If yes, inform patient they can request to dis-enroll from the [facility] by calling the patient help desk at NOT APPLICABLE.      If the copay is under the $25 defined limit, per policy there will be no further investigation of need for financial assistance at this time unless patient requests. This referral has been communicated to the provider and handed off to the [facility] team for further processing and filling of prescribed medication.   ______________________________________________________________________  Please utilize this referral for viewing purposes as it will serve as the central location for all relevant documentation and updates.

## 2018-11-11 ENCOUNTER — Emergency Department
Admit: 2018-11-11 | Discharge: 2018-11-11 | Disposition: A | Discharge status: Home / Self Care | Payer: MEDICARE | Attending: Emergency Medicine

## 2018-11-11 ENCOUNTER — Ambulatory Visit
Admit: 2018-11-11 | Discharge: 2018-11-11 | Disposition: A | Discharge status: Home / Self Care | Payer: MEDICARE | Attending: Emergency Medicine

## 2018-11-11 DIAGNOSIS — R079 Chest pain, unspecified: Secondary | ICD-10-CM

## 2018-11-11 DIAGNOSIS — Z7982 Long term (current) use of aspirin: Secondary | ICD-10-CM

## 2018-11-11 DIAGNOSIS — I10 Essential (primary) hypertension: Secondary | ICD-10-CM

## 2018-11-11 DIAGNOSIS — I252 Old myocardial infarction: Secondary | ICD-10-CM

## 2018-11-11 DIAGNOSIS — R0789 Other chest pain: Secondary | ICD-10-CM

## 2018-12-28 ENCOUNTER — Ambulatory Visit: Admit: 2018-12-28 | Discharge: 2018-12-29 | Payer: MEDICARE

## 2018-12-28 DIAGNOSIS — E785 Hyperlipidemia, unspecified: Principal | ICD-10-CM

## 2018-12-28 DIAGNOSIS — I240 Acute coronary thrombosis not resulting in myocardial infarction: Principal | ICD-10-CM

## 2018-12-28 DIAGNOSIS — I10 Essential (primary) hypertension: Principal | ICD-10-CM

## 2018-12-28 MED ORDER — HYDROCHLOROTHIAZIDE 25 MG TABLET
ORAL_TABLET | Freq: Every day | ORAL | 6 refills | 30 days | Status: CP
Start: 2018-12-28 — End: ?

## 2018-12-28 MED ORDER — ROSUVASTATIN 5 MG TABLET
ORAL_TABLET | Freq: Every day | ORAL | 6 refills | 30 days | Status: CP
Start: 2018-12-28 — End: ?

## 2019-02-15 MED ORDER — METOPROLOL SUCCINATE ER 25 MG TABLET,EXTENDED RELEASE 24 HR
ORAL_TABLET | 3 refills | 0 days | Status: CP
Start: 2019-02-15 — End: ?

## 2019-04-10 DIAGNOSIS — M5416 Radiculopathy, lumbar region: Secondary | ICD-10-CM | POA: Insufficient documentation

## 2019-06-19 MED ORDER — ROSUVASTATIN 5 MG TABLET
ORAL_TABLET | 0 refills | 0 days | Status: CP
Start: 2019-06-19 — End: ?

## 2019-07-05 MED ORDER — HYDROCHLOROTHIAZIDE 25 MG TABLET
ORAL_TABLET | 1 refills | 0.00000 days | Status: CP
Start: 2019-07-05 — End: ?

## 2019-07-23 ENCOUNTER — Ambulatory Visit: Admit: 2019-07-23 | Discharge: 2019-07-24 | Payer: MEDICARE

## 2019-07-24 DIAGNOSIS — I2510 Atherosclerotic heart disease of native coronary artery without angina pectoris: Secondary | ICD-10-CM | POA: Insufficient documentation

## 2019-07-24 DIAGNOSIS — R079 Chest pain, unspecified: Secondary | ICD-10-CM | POA: Insufficient documentation

## 2019-07-24 DIAGNOSIS — I10 Essential (primary) hypertension: Secondary | ICD-10-CM | POA: Insufficient documentation

## 2019-07-24 MED ORDER — AMLODIPINE 10 MG TABLET
ORAL_TABLET | Freq: Every day | ORAL | 3 refills | 90.00000 days | Status: CN
Start: 2019-07-24 — End: 2020-07-23

## 2019-08-15 ENCOUNTER — Ambulatory Visit: Admit: 2019-08-15 | Discharge: 2019-08-16 | Payer: MEDICARE

## 2019-08-15 DIAGNOSIS — E785 Hyperlipidemia, unspecified: Principal | ICD-10-CM

## 2019-08-15 DIAGNOSIS — I259 Chronic ischemic heart disease, unspecified: Secondary | ICD-10-CM

## 2019-08-15 DIAGNOSIS — I10 Essential (primary) hypertension: Principal | ICD-10-CM

## 2019-08-15 DIAGNOSIS — I240 Acute coronary thrombosis not resulting in myocardial infarction: Principal | ICD-10-CM

## 2019-08-15 MED ORDER — AMLODIPINE 5 MG TABLET
ORAL_TABLET | Freq: Every day | ORAL | 3 refills | 90.00000 days | Status: CP
Start: 2019-08-15 — End: ?

## 2019-08-15 MED ORDER — EVOLOCUMAB 140 MG/ML SUBCUTANEOUS PEN INJECTOR
SUBCUTANEOUS | 6 refills | 0.00000 days | Status: CP
Start: 2019-08-15 — End: ?

## 2019-08-16 MED ORDER — EVOLOCUMAB 140 MG/ML SUBCUTANEOUS PEN INJECTOR
SUBCUTANEOUS | 11 refills | 0.00000 days | Status: CP
Start: 2019-08-16 — End: ?

## 2019-09-05 DIAGNOSIS — I259 Chronic ischemic heart disease, unspecified: Principal | ICD-10-CM

## 2019-09-05 DIAGNOSIS — I240 Acute coronary thrombosis not resulting in myocardial infarction: Principal | ICD-10-CM

## 2019-09-05 MED ORDER — PRALUENT PEN 150 MG/ML SUBCUTANEOUS PEN INJECTOR
SUBCUTANEOUS | 11 refills | 28 days | Status: CP
Start: 2019-09-05 — End: ?

## 2019-10-25 ENCOUNTER — Ambulatory Visit: Admit: 2019-10-25 | Discharge: 2019-10-26 | Payer: MEDICARE

## 2019-12-28 MED ORDER — HYDROCHLOROTHIAZIDE 25 MG TABLET
ORAL_TABLET | 1 refills | 0 days | Status: CP
Start: 2019-12-28 — End: ?

## 2020-01-07 MED ORDER — METOPROLOL SUCCINATE ER 25 MG TABLET,EXTENDED RELEASE 24 HR
ORAL_TABLET | ORAL | 3 refills | 0.00000 days | Status: CP
Start: 2020-01-07 — End: ?

## 2020-01-24 MED ORDER — AMLODIPINE 10 MG TABLET
Freq: Every day | ORAL | 0.00000 days
Start: 2020-01-24 — End: ?

## 2020-02-21 ENCOUNTER — Ambulatory Visit: Admit: 2020-02-21 | Discharge: 2020-02-22 | Payer: MEDICARE

## 2020-02-21 DIAGNOSIS — I10 Essential (primary) hypertension: Principal | ICD-10-CM

## 2020-02-21 DIAGNOSIS — I240 Acute coronary thrombosis not resulting in myocardial infarction: Principal | ICD-10-CM

## 2020-02-21 DIAGNOSIS — I259 Chronic ischemic heart disease, unspecified: Principal | ICD-10-CM

## 2020-02-21 DIAGNOSIS — R600 Localized edema: Principal | ICD-10-CM

## 2020-02-21 DIAGNOSIS — E785 Hyperlipidemia, unspecified: Principal | ICD-10-CM

## 2020-02-21 MED ORDER — TELMISARTAN 20 MG TABLET
ORAL_TABLET | Freq: Every day | ORAL | 3 refills | 90 days | Status: CP
Start: 2020-02-21 — End: ?

## 2020-02-22 ENCOUNTER — Ambulatory Visit: Admit: 2020-02-22 | Discharge: 2020-02-23 | Payer: MEDICARE

## 2020-02-22 DIAGNOSIS — R600 Localized edema: Principal | ICD-10-CM

## 2020-02-22 DIAGNOSIS — M7989 Other specified soft tissue disorders: Secondary | ICD-10-CM | POA: Diagnosis not present

## 2020-03-24 ENCOUNTER — Ambulatory Visit: Admit: 2020-03-24 | Discharge: 2020-03-25 | Payer: MEDICARE

## 2020-03-24 DIAGNOSIS — I10 Essential (primary) hypertension: Principal | ICD-10-CM

## 2020-04-08 MED ORDER — VALSARTAN 80 MG TABLET
ORAL_TABLET | Freq: Every day | ORAL | 3 refills | 90.00000 days | Status: CP
Start: 2020-04-08 — End: ?

## 2020-04-23 DIAGNOSIS — H524 Presbyopia: Secondary | ICD-10-CM | POA: Diagnosis not present

## 2020-06-25 DIAGNOSIS — I240 Acute coronary thrombosis not resulting in myocardial infarction: Principal | ICD-10-CM

## 2020-06-25 DIAGNOSIS — I259 Chronic ischemic heart disease, unspecified: Principal | ICD-10-CM

## 2020-06-25 MED ORDER — PRALUENT PEN 150 MG/ML SUBCUTANEOUS PEN INJECTOR
SUBCUTANEOUS | 3 refills | 84.00000 days | Status: CP
Start: 2020-06-25 — End: ?

## 2020-06-29 MED ORDER — HYDROCHLOROTHIAZIDE 25 MG TABLET
ORAL_TABLET | 1 refills | 0 days
Start: 2020-06-29 — End: ?

## 2020-07-01 MED ORDER — HYDROCHLOROTHIAZIDE 25 MG TABLET
ORAL_TABLET | Freq: Every day | ORAL | 3 refills | 90.00000 days | Status: CP
Start: 2020-07-01 — End: ?

## 2020-07-17 DIAGNOSIS — I240 Acute coronary thrombosis not resulting in myocardial infarction: Principal | ICD-10-CM

## 2020-07-17 DIAGNOSIS — I259 Chronic ischemic heart disease, unspecified: Principal | ICD-10-CM

## 2020-07-17 MED ORDER — PRALUENT PEN 150 MG/ML SUBCUTANEOUS PEN INJECTOR
SUBCUTANEOUS | 3 refills | 84.00000 days | Status: CP
Start: 2020-07-17 — End: ?

## 2020-08-12 DIAGNOSIS — I252 Old myocardial infarction: Secondary | ICD-10-CM | POA: Diagnosis not present

## 2020-08-12 DIAGNOSIS — I2510 Atherosclerotic heart disease of native coronary artery without angina pectoris: Secondary | ICD-10-CM | POA: Diagnosis not present

## 2020-08-12 DIAGNOSIS — Z8249 Family history of ischemic heart disease and other diseases of the circulatory system: Secondary | ICD-10-CM | POA: Diagnosis not present

## 2020-08-12 DIAGNOSIS — I739 Peripheral vascular disease, unspecified: Secondary | ICD-10-CM | POA: Diagnosis not present

## 2020-08-12 DIAGNOSIS — I10 Essential (primary) hypertension: Secondary | ICD-10-CM | POA: Diagnosis not present

## 2020-08-12 DIAGNOSIS — E785 Hyperlipidemia, unspecified: Secondary | ICD-10-CM | POA: Diagnosis not present

## 2020-08-25 ENCOUNTER — Ambulatory Visit: Admit: 2020-08-25 | Discharge: 2020-08-26 | Payer: MEDICARE

## 2020-08-25 DIAGNOSIS — I240 Acute coronary thrombosis not resulting in myocardial infarction: Principal | ICD-10-CM

## 2020-08-25 DIAGNOSIS — I10 Essential (primary) hypertension: Principal | ICD-10-CM

## 2020-08-25 DIAGNOSIS — I259 Chronic ischemic heart disease, unspecified: Principal | ICD-10-CM

## 2020-08-25 DIAGNOSIS — E785 Hyperlipidemia, unspecified: Principal | ICD-10-CM

## 2020-09-14 MED ORDER — AMLODIPINE 5 MG TABLET
ORAL_TABLET | ORAL | 3 refills | 0.00000 days | Status: CP
Start: 2020-09-14 — End: ?

## 2020-10-11 MED ORDER — AMLODIPINE 10 MG TABLET
ORAL_TABLET | 7 refills | 0.00000 days
Start: 2020-10-11 — End: ?

## 2020-10-14 MED ORDER — AMLODIPINE 10 MG TABLET
ORAL_TABLET | 7 refills | 0 days
Start: 2020-10-14 — End: ?

## 2020-10-19 DIAGNOSIS — I240 Acute coronary thrombosis not resulting in myocardial infarction: Principal | ICD-10-CM

## 2020-10-19 DIAGNOSIS — I259 Chronic ischemic heart disease, unspecified: Principal | ICD-10-CM

## 2020-10-19 MED ORDER — PRALUENT PEN 150 MG/ML SUBCUTANEOUS PEN INJECTOR
SUBCUTANEOUS | 3 refills | 84 days
Start: 2020-10-19 — End: ?

## 2020-10-20 MED ORDER — PRALUENT PEN 150 MG/ML SUBCUTANEOUS PEN INJECTOR
SUBCUTANEOUS | 3 refills | 84 days | Status: CP
Start: 2020-10-20 — End: ?

## 2020-10-23 DIAGNOSIS — I259 Chronic ischemic heart disease, unspecified: Principal | ICD-10-CM

## 2020-10-23 DIAGNOSIS — I240 Acute coronary thrombosis not resulting in myocardial infarction: Principal | ICD-10-CM

## 2020-10-23 MED ORDER — PRALUENT PEN 150 MG/ML SUBCUTANEOUS PEN INJECTOR
SUBCUTANEOUS | 3 refills | 84 days
Start: 2020-10-23 — End: ?

## 2020-11-14 MED ORDER — METOPROLOL SUCCINATE ER 25 MG TABLET,EXTENDED RELEASE 24 HR
ORAL_TABLET | Freq: Every day | ORAL | 3 refills | 90 days | Status: CP
Start: 2020-11-14 — End: ?

## 2020-11-14 MED ORDER — TELMISARTAN 20 MG TABLET
ORAL_TABLET | Freq: Every day | ORAL | 3 refills | 90.00000 days | Status: CP
Start: 2020-11-14 — End: ?

## 2020-11-25 ENCOUNTER — Ambulatory Visit: Admit: 2020-11-25 | Discharge: 2020-11-26 | Payer: MEDICARE

## 2020-11-25 DIAGNOSIS — I214 Non-ST elevation (NSTEMI) myocardial infarction: Secondary | ICD-10-CM | POA: Diagnosis not present

## 2020-11-25 DIAGNOSIS — R079 Chest pain, unspecified: Secondary | ICD-10-CM | POA: Diagnosis not present

## 2020-11-25 DIAGNOSIS — E785 Hyperlipidemia, unspecified: Secondary | ICD-10-CM | POA: Diagnosis not present

## 2020-11-25 DIAGNOSIS — E119 Type 2 diabetes mellitus without complications: Secondary | ICD-10-CM | POA: Diagnosis not present

## 2020-11-25 DIAGNOSIS — I10 Essential (primary) hypertension: Secondary | ICD-10-CM | POA: Diagnosis not present

## 2020-11-25 DIAGNOSIS — I255 Ischemic cardiomyopathy: Secondary | ICD-10-CM | POA: Diagnosis not present

## 2020-11-25 DIAGNOSIS — R0789 Other chest pain: Secondary | ICD-10-CM | POA: Diagnosis not present

## 2020-11-25 DIAGNOSIS — R918 Other nonspecific abnormal finding of lung field: Secondary | ICD-10-CM | POA: Diagnosis not present

## 2020-11-25 DIAGNOSIS — R778 Other specified abnormalities of plasma proteins: Secondary | ICD-10-CM | POA: Diagnosis not present

## 2020-11-25 DIAGNOSIS — R6889 Other general symptoms and signs: Secondary | ICD-10-CM | POA: Diagnosis not present

## 2020-11-25 DIAGNOSIS — Z20822 Contact with and (suspected) exposure to covid-19: Secondary | ICD-10-CM | POA: Diagnosis not present

## 2020-11-25 DIAGNOSIS — Z23 Encounter for immunization: Secondary | ICD-10-CM | POA: Diagnosis not present

## 2020-11-25 DIAGNOSIS — Z743 Need for continuous supervision: Secondary | ICD-10-CM | POA: Diagnosis not present

## 2020-11-25 DIAGNOSIS — I2510 Atherosclerotic heart disease of native coronary artery without angina pectoris: Secondary | ICD-10-CM | POA: Diagnosis not present

## 2020-11-25 DIAGNOSIS — R404 Transient alteration of awareness: Secondary | ICD-10-CM | POA: Diagnosis not present

## 2020-11-25 DIAGNOSIS — R42 Dizziness and giddiness: Secondary | ICD-10-CM | POA: Diagnosis not present

## 2020-11-25 DIAGNOSIS — R55 Syncope and collapse: Secondary | ICD-10-CM | POA: Diagnosis not present

## 2020-11-26 DIAGNOSIS — I071 Rheumatic tricuspid insufficiency: Secondary | ICD-10-CM | POA: Diagnosis not present

## 2020-11-26 DIAGNOSIS — R55 Syncope and collapse: Secondary | ICD-10-CM | POA: Diagnosis not present

## 2020-11-26 DIAGNOSIS — I214 Non-ST elevation (NSTEMI) myocardial infarction: Secondary | ICD-10-CM | POA: Diagnosis not present

## 2020-11-26 DIAGNOSIS — R079 Chest pain, unspecified: Secondary | ICD-10-CM | POA: Diagnosis not present

## 2020-11-26 DIAGNOSIS — I2510 Atherosclerotic heart disease of native coronary artery without angina pectoris: Secondary | ICD-10-CM | POA: Diagnosis not present

## 2020-11-26 DIAGNOSIS — Z955 Presence of coronary angioplasty implant and graft: Secondary | ICD-10-CM | POA: Diagnosis not present

## 2020-11-26 DIAGNOSIS — I255 Ischemic cardiomyopathy: Secondary | ICD-10-CM | POA: Diagnosis not present

## 2020-12-01 ENCOUNTER — Other Ambulatory Visit: Payer: Self-pay

## 2020-12-01 ENCOUNTER — Ambulatory Visit (INDEPENDENT_AMBULATORY_CARE_PROVIDER_SITE_OTHER): Payer: Medicare HMO | Admitting: Family Medicine

## 2020-12-01 ENCOUNTER — Encounter: Payer: Self-pay | Admitting: Family Medicine

## 2020-12-01 VITALS — BP 128/72 | HR 79 | Resp 16 | Ht 63.0 in | Wt 174.0 lb

## 2020-12-01 DIAGNOSIS — E1165 Type 2 diabetes mellitus with hyperglycemia: Secondary | ICD-10-CM

## 2020-12-01 DIAGNOSIS — I214 Non-ST elevation (NSTEMI) myocardial infarction: Secondary | ICD-10-CM | POA: Diagnosis not present

## 2020-12-01 DIAGNOSIS — E7800 Pure hypercholesterolemia, unspecified: Secondary | ICD-10-CM | POA: Diagnosis not present

## 2020-12-01 DIAGNOSIS — I25110 Atherosclerotic heart disease of native coronary artery with unstable angina pectoris: Secondary | ICD-10-CM | POA: Diagnosis not present

## 2020-12-01 NOTE — Progress Notes (Signed)
I,April Miller,acting as a scribe for Megan Mans, MD.,have documented all relevant documentation on the behalf of Megan Mans, MD,as directed by  Megan Mans, MD while in the presence of Megan Mans, MD.   Established patient visit   Patient: Sheila Herrera   DOB: 03-10-1950   70 y.o. Female  MRN: [id number] Visit Date: 12/01/2020  Today's healthcare provider: Megan Mans, MD   Chief Complaint  Patient presents with   Hospitalization Follow-up   Subjective    HPI  70 year old patient is new to me.  Has a history of CAD and was admitted this week with non-STEMI.  Cath was stable with no intervention and recommended aggressive medical management.  Feels well.  He has never smoked.  States she had a Pneumovax done at [facility]. Follow up ER visit  Patient was seen in ER for Chest Pain on 11/25/2020. She was treated for; Chest pain with history of coronary artery disease. Treatment for this included; see notes in chart. She reports good compliance with treatment. She reports this condition is Improved.  -----------------------------------------------------------------------------------------  Patient states she is feeling much better since ER visit.     Medications: Outpatient Medications Prior to Visit  Medication Sig   Alirocumab (PRALUENT) 150 MG/ML SOAJ Inject into the skin.   amLODipine (NORVASC) 5 MG tablet Take 1 tablet by mouth daily.   aspirin 81 MG chewable tablet Chew 81 mg by mouth.   hydrochlorothiazide (HYDRODIURIL) 25 MG tablet hydrochlorothiazide 25 mg tablet  TAKE 1 TABLET BY MOUTH EVERY DAY   metoprolol succinate (TOPROL-XL) 25 MG 24 hr tablet Take 12.5 mg by mouth.   telmisartan (MICARDIS) 20 MG tablet Take 1 tablet by mouth daily.   [DISCONTINUED] nitroGLYCERIN (NITROSTAT) 0.4 MG SL tablet Place 0.4 mg under the tongue.   [DISCONTINUED] prasugrel (EFFIENT) 10 MG TABS tablet Take 10 mg by mouth.   [DISCONTINUED]  rosuvastatin (CRESTOR) 5 MG tablet Take 5 mg by mouth.   [DISCONTINUED] sulfamethoxazole-trimethoprim (BACTRIM DS,SEPTRA DS) 800-160 MG tablet Take 1 tablet by mouth 2 (two) times daily.   No facility-administered medications prior to visit.    Review of Systems  Constitutional:  Negative for appetite change, chills, fatigue and fever.  Respiratory:  Negative for chest tightness and shortness of breath.   Cardiovascular:  Negative for chest pain and palpitations.  Gastrointestinal:  Negative for abdominal pain, nausea and vomiting.  Neurological:  Negative for dizziness and weakness.       Objective    BP 128/72 (BP Location: Left Arm, Patient Position: Sitting, Cuff Size: Normal)   Pulse 79   Resp 16   Ht 5\' 3"  (1.6 m)   Wt 174 lb (78.9 kg)   SpO2 100%   BMI 30.82 kg/m  BP Readings from Last 3 Encounters:  12/01/20 128/72  06/10/16 120/70  02/05/16 140/80   Wt Readings from Last 3 Encounters:  12/01/20 174 lb (78.9 kg)  06/10/16 163 lb 12.8 oz (74.3 kg)  02/05/16 156 lb 3.2 oz (70.9 kg)      Physical Exam Vitals reviewed.  Constitutional:      General: She is not in acute distress.    Appearance: She is well-developed.  HENT:     Head: Normocephalic and atraumatic.     Right Ear: Hearing normal.     Left Ear: Hearing normal.     Nose: Nose normal.  Eyes:     General: Lids are normal. No scleral icterus.  Right eye: No discharge.        Left eye: No discharge.     Conjunctiva/sclera: Conjunctivae normal.  Cardiovascular:     Rate and Rhythm: Normal rate and regular rhythm.     Heart sounds: Normal heart sounds.  Pulmonary:     Effort: Pulmonary effort is normal. No respiratory distress.  Skin:    Findings: No lesion or rash.  Neurological:     General: No focal deficit present.     Mental Status: She is alert and oriented to person, place, and time.  Psychiatric:        Mood and Affect: Mood normal.        Speech: Speech normal.        Behavior:  Behavior normal.        Thought Content: Thought content normal.        Judgment: Judgment normal.      No results found for any visits on 12/01/20.  Assessment & Plan     1. Coronary artery disease involving native coronary artery of native heart with unstable angina pectoris (HCC) Followed by cardiology.  Patient is clinically stable and will have a maximum medical management.  She is on Effient for her lipids  2. NSTEMI (non-ST elevated myocardial infarction) (HCC) Clinically stable with no chest pain.  3. Pure hypercholesterolemia On Effient  4. Type 2 diabetes mellitus with hyperglycemia, without long-term current use of insulin (HCC) A1C was 6.9 in the hospital.  This will need follow-up and aggressive treatment going forward.   No follow-ups on file.      I, Megan Mans, MD, have reviewed all documentation for this visit. The documentation on 12/06/20 for the exam, diagnosis, procedures, and orders are all accurate and complete.    Alani Lacivita Wendelyn Breslow, MD  [facility] [phone] (phone) [phone] (fax)  [facility]

## 2020-12-08 ENCOUNTER — Ambulatory Visit: Admit: 2020-12-08 | Discharge: 2020-12-09 | Payer: MEDICARE

## 2020-12-08 DIAGNOSIS — E785 Hyperlipidemia, unspecified: Principal | ICD-10-CM

## 2020-12-08 DIAGNOSIS — I10 Essential (primary) hypertension: Principal | ICD-10-CM

## 2020-12-08 DIAGNOSIS — I240 Acute coronary thrombosis not resulting in myocardial infarction: Principal | ICD-10-CM

## 2020-12-08 DIAGNOSIS — I259 Chronic ischemic heart disease, unspecified: Principal | ICD-10-CM

## 2020-12-08 MED ORDER — NITROGLYCERIN 0.4 MG SUBLINGUAL TABLET
ORAL_TABLET | SUBLINGUAL | 3 refills | 1.00000 days | Status: CP | PRN
Start: 2020-12-08 — End: ?

## 2020-12-17 MED ORDER — AMLODIPINE 5 MG TABLET
ORAL_TABLET | Freq: Every day | ORAL | 3 refills | 90.00000 days | Status: CP
Start: 2020-12-17 — End: ?

## 2020-12-19 MED ORDER — AMLODIPINE 5 MG TABLET
ORAL_TABLET | Freq: Every day | ORAL | 3 refills | 90 days | Status: CP
Start: 2020-12-19 — End: ?

## 2020-12-29 DIAGNOSIS — I240 Acute coronary thrombosis not resulting in myocardial infarction: Principal | ICD-10-CM

## 2020-12-29 DIAGNOSIS — I259 Chronic ischemic heart disease, unspecified: Principal | ICD-10-CM

## 2020-12-29 MED ORDER — METOPROLOL SUCCINATE ER 25 MG TABLET,EXTENDED RELEASE 24 HR
ORAL_TABLET | Freq: Every day | ORAL | 3 refills | 90.00000 days | Status: CP
Start: 2020-12-29 — End: ?

## 2020-12-29 MED ORDER — TELMISARTAN 20 MG TABLET
ORAL_TABLET | Freq: Every day | ORAL | 3 refills | 90.00000 days | Status: CP
Start: 2020-12-29 — End: ?

## 2020-12-29 MED ORDER — AMLODIPINE 5 MG TABLET
ORAL_TABLET | Freq: Every day | ORAL | 3 refills | 90 days | Status: CP
Start: 2020-12-29 — End: ?

## 2020-12-29 MED ORDER — PRALUENT PEN 150 MG/ML SUBCUTANEOUS PEN INJECTOR
SUBCUTANEOUS | 3 refills | 84 days | Status: CP
Start: 2020-12-29 — End: ?

## 2020-12-29 MED ORDER — HYDROCHLOROTHIAZIDE 25 MG TABLET
ORAL_TABLET | Freq: Every day | ORAL | 3 refills | 90 days | Status: CP
Start: 2020-12-29 — End: ?

## 2021-02-08 DIAGNOSIS — R300 Dysuria: Secondary | ICD-10-CM | POA: Diagnosis not present

## 2021-02-11 ENCOUNTER — Ambulatory Visit: Payer: Self-pay

## 2021-02-11 NOTE — Telephone Encounter (Signed)
Chief Complaint: Abdominal pain/UTI Symptoms: Pain to inside and outside abdomen, urgency, pain with urination, frequency Frequency: symptoms started 3 weeks ago Pertinent Negatives: Patient denies flank pain Disposition: [] ED /[] Urgent Care (no appt availability in office) / [x] Appointment(In office/virtual)/ []  [facility]/ [] Home Care/ [] Refused Recommended Disposition /[] [place] [facility]/ []  Follow-up with PCP Additional Notes: She went to an UC on Sunday, prescribed Bactrim DS, took last pill this morning, symptoms not improved, urine culture taken at the UC. She says she will bring a urine sample tomorrow in a sterile cup she has at home, because she can't urinate on the spot. I advised to refrigerate the sample she takes preferably before she leaves home or as close to the appointment as possible, she verbalized understanding.   Walkin on Sunday, trace of leukocytes in urine. Prescribed Bactrim, Reason for Disposition  [1] Taking antibiotic > 72 hours (3 days) for UTI AND [2] painful urination or frequency not improved  Answer Assessment - Initial Assessment Questions 1. ANTIBIOTIC: "What antibiotic are you taking?" "How many times per day?"     Bactrim DS finished this morning. Prescribed BID 2. DURATION: "When was the antibiotic started?"     Sunday 02/08/21 3. MAIN SYMPTOM: "What is the main symptom you are concerned about?"     Pain outside of abdomen and inside 4. FEVER: "Do you have a fever?" If Yes, ask: "What is it, how was it measured, and when did it start?"     No 5. OTHER SYMPTOMS: "Do you have any other symptoms?" (e.g., flank pain, vaginal discharge, blood in urine)     Urgency, pain with urination  Protocols used: Urinary Tract Infection on Antibiotic Follow-up Call - [facility]

## 2021-02-12 ENCOUNTER — Ambulatory Visit (INDEPENDENT_AMBULATORY_CARE_PROVIDER_SITE_OTHER): Payer: Medicare HMO | Admitting: Family Medicine

## 2021-02-12 ENCOUNTER — Other Ambulatory Visit: Payer: Self-pay

## 2021-02-12 ENCOUNTER — Encounter: Payer: Self-pay | Admitting: Family Medicine

## 2021-02-12 VITALS — BP 126/74 | HR 76 | Resp 16 | Wt 171.2 lb

## 2021-02-12 DIAGNOSIS — N898 Other specified noninflammatory disorders of vagina: Secondary | ICD-10-CM | POA: Diagnosis not present

## 2021-02-12 DIAGNOSIS — N3001 Acute cystitis with hematuria: Secondary | ICD-10-CM | POA: Diagnosis not present

## 2021-02-12 DIAGNOSIS — R319 Hematuria, unspecified: Secondary | ICD-10-CM | POA: Diagnosis not present

## 2021-02-12 DIAGNOSIS — N3090 Cystitis, unspecified without hematuria: Secondary | ICD-10-CM | POA: Insufficient documentation

## 2021-02-12 DIAGNOSIS — N9089 Other specified noninflammatory disorders of vulva and perineum: Secondary | ICD-10-CM

## 2021-02-12 DIAGNOSIS — Z87440 Personal history of urinary (tract) infections: Secondary | ICD-10-CM | POA: Diagnosis not present

## 2021-02-12 LAB — POCT URINALYSIS DIPSTICK
Bilirubin, UA: NEGATIVE
Glucose, UA: NEGATIVE
Ketones, UA: NEGATIVE
Nitrite, UA: NEGATIVE
Protein, UA: NEGATIVE
Spec Grav, UA: 1.01 (ref 1.010–1.025)
Urobilinogen, UA: 2 E.U./dL — AB
pH, UA: 5 (ref 5.0–8.0)

## 2021-02-12 MED ORDER — CEPHALEXIN 500 MG PO CAPS: 500.0000 mg | ORAL_CAPSULE | Freq: Three times a day (TID) | ORAL | 0 refills | Status: DC

## 2021-02-12 MED ORDER — ESTRADIOL 0.5 MG PO TABS: 0.5000 mg | ORAL_TABLET | Freq: Every day | ORAL | 0 refills | Status: DC

## 2021-02-12 MED ORDER — TRIAMCINOLONE ACETONIDE 0.5 % EX OINT: 1.0000 "application " | TOPICAL_OINTMENT | Freq: Two times a day (BID) | CUTANEOUS | 0 refills | Status: DC

## 2021-02-12 NOTE — Progress Notes (Signed)
Established patient visit   Patient: Sheila Herrera   DOB: 1950-06-04   71 y.o. Female  MRN: [id number] Visit Date: 02/12/2021  Today's healthcare provider: Gwyneth Sprout, FNP   Chief Complaint  Patient presents with   Urinary Tract Infection   Subjective    Urinary Tract Infection   HPI     Urinary Tract Infection   This is a new problem.  Recent episode started in the past 7 days.  The problem has been unchanged since onset.  Severity of the pain is moderate.  The problem occurs every urination.  The patient  has been recently treated for similar symptoms (Patient was seen at urgent care 4 days ago and prescribed antibiotic ).  Abdominal Pain: Absent.  Back Pain: Absent.  Chills: Absent.  Cloudy malodorus urine: Absent.  Constipation: Absent.  Cramping: Absent.  Diarrhea: Absent.  Discharge: Absent.  Fever: Absent.  Hematuria: Absent.  Nausea: Absent.  Vomiting: Absent.      Last edited by Minette Headland, CMA on 02/12/2021  1:27 PM.        Medications: Outpatient Medications Prior to Visit  Medication Sig   Alirocumab (PRALUENT) 150 MG/ML SOAJ Inject into the skin.   amLODipine (NORVASC) 5 MG tablet Take 1 tablet by mouth daily.   aspirin 81 MG chewable tablet Chew 81 mg by mouth.   hydrochlorothiazide (HYDRODIURIL) 25 MG tablet hydrochlorothiazide 25 mg tablet  TAKE 1 TABLET BY MOUTH EVERY DAY   metoprolol succinate (TOPROL-XL) 25 MG 24 hr tablet Take 12.5 mg by mouth.   nitroGLYCERIN (NITROSTAT) 0.4 MG SL tablet    telmisartan (MICARDIS) 20 MG tablet Take 1 tablet by mouth daily.   No facility-administered medications prior to visit.    Review of Systems  Genitourinary:  Positive for dysuria.      Objective    BP 126/74    Pulse 76    Resp 16    Wt 171 lb 3.2 oz (77.7 kg)    SpO2 100%    BMI 30.33 kg/m    Physical Exam Vitals and nursing note reviewed.  Constitutional:      General: She is not in acute distress.    Appearance: Normal  appearance. She is obese. She is not ill-appearing, toxic-appearing or diaphoretic.  HENT:     Head: Normocephalic and atraumatic.  Cardiovascular:     Rate and Rhythm: Normal rate and regular rhythm.     Pulses: Normal pulses.     Heart sounds: Normal heart sounds. No murmur heard.   No friction rub. No gallop.  Pulmonary:     Effort: Pulmonary effort is normal. No respiratory distress.     Breath sounds: Normal breath sounds. No stridor. No wheezing, rhonchi or rales.  Chest:     Chest wall: No tenderness.  Abdominal:     General: Bowel sounds are normal.     Palpations: Abdomen is soft.     Tenderness: There is no abdominal tenderness. There is no right CVA tenderness, left CVA tenderness, guarding or rebound.  Genitourinary:    Comments: Exam deferred; denies complaints Does not endorse risk for STI based on behavior Musculoskeletal:        General: No swelling, tenderness, deformity or signs of injury. Normal range of motion.     Right lower leg: No edema.     Left lower leg: No edema.  Skin:    General: Skin is warm and dry.  Capillary Refill: Capillary refill takes less than 2 seconds.     Coloration: Skin is not jaundiced or pale.     Findings: No bruising, erythema, lesion or rash.  Neurological:     General: No focal deficit present.     Mental Status: She is alert and oriented to person, place, and time. Mental status is at baseline.     Cranial Nerves: No cranial nerve deficit.     Sensory: No sensory deficit.     Motor: No weakness.     Coordination: Coordination normal.  Psychiatric:        Mood and Affect: Mood normal.        Behavior: Behavior normal.        Thought Content: Thought content normal.        Judgment: Judgment normal.      Results for orders placed or performed in visit on 02/12/21  POCT urinalysis dipstick  Result Value Ref Range   Color, UA yellow    Clarity, UA clear    Glucose, UA Negative Negative   Bilirubin, UA negative     Ketones, UA negative    Spec Grav, UA 1.010 1.010 - 1.025   Blood, UA non hemolyzed small    pH, UA 5.0 5.0 - 8.0   Protein, UA Negative Negative   Urobilinogen, UA 2.0 (A) 0.2 or 1.0 E.U./dL   Nitrite, UA negative    Leukocytes, UA Large (3+) (A) Negative   Appearance     Odor      Assessment & Plan     Problem List Items Addressed This Visit       Genitourinary   Cystitis - Primary   Relevant Medications   cephALEXin (KEFLEX) 500 MG capsule   Other Relevant Orders   Urine Culture   POCT urinalysis dipstick (Completed)   Vulvovaginal dryness    Steroid cream for external area, small pea size amount       Relevant Medications   triamcinolone ointment (KENALOG) 0.5 %   estradiol (ESTRACE) 0.5 MG tablet   Acute cystitis with hematuria    Was seen outside [facility]; received Abx Continued to have s/s Did not receive Cx UA + today, plan to repeat Cx Start new Abx Discussed RFs      Relevant Medications   cephALEXin (KEFLEX) 500 MG capsule     Other   History of recurrent UTI (urinary tract infection)    Within the month; did not get results from cx      Relevant Medications   cephALEXin (KEFLEX) 500 MG capsule   Hematuria    Hemolyzed on sample      Relevant Medications   cephALEXin (KEFLEX) 500 MG capsule     Return if symptoms worsen or fail to improve.      Vonna Kotyk, FNP, have reviewed all documentation for this visit. The documentation on 02/12/21 for the exam, diagnosis, procedures, and orders are all accurate and complete.    Gwyneth Sprout, [place] [phone] (phone) [phone] (fax)  [place]

## 2021-02-12 NOTE — Assessment & Plan Note (Signed)
Steroid cream for external area, small pea size amount

## 2021-02-12 NOTE — Assessment & Plan Note (Signed)
Within the month; did not get results from cx

## 2021-02-12 NOTE — Assessment & Plan Note (Signed)
Hemolyzed on sample

## 2021-02-12 NOTE — Patient Instructions (Signed)

## 2021-02-12 NOTE — Assessment & Plan Note (Signed)
Was seen outside [facility]; received Abx Continued to have s/s Did not receive Cx UA + today, plan to repeat Cx Start new Abx Discussed RFs

## 2021-02-14 LAB — URINE CULTURE

## 2021-02-16 ENCOUNTER — Ambulatory Visit: Payer: Self-pay | Admitting: *Deleted

## 2021-02-16 ENCOUNTER — Other Ambulatory Visit: Payer: Self-pay | Admitting: Family Medicine

## 2021-02-16 DIAGNOSIS — Z87440 Personal history of urinary (tract) infections: Secondary | ICD-10-CM

## 2021-02-16 DIAGNOSIS — N3001 Acute cystitis with hematuria: Secondary | ICD-10-CM

## 2021-02-16 DIAGNOSIS — N898 Other specified noninflammatory disorders of vagina: Secondary | ICD-10-CM

## 2021-02-16 DIAGNOSIS — R319 Hematuria, unspecified: Secondary | ICD-10-CM

## 2021-02-16 NOTE — Telephone Encounter (Signed)
I didn't see anything else you wrote under her lab report in regards to her response and request for medication for burning.

## 2021-02-16 NOTE — Progress Notes (Signed)
Poor sampling for UTI; mixed flora on Culture. Are you continued to experience symptoms? Would recommend referral to urology as we discussed.  Please let us know if you have any questions.  Thank you,  Merita Norton, FNP

## 2021-02-16 NOTE — Telephone Encounter (Signed)
Advised patient as below, patient states that ointment made vulva area burn more and states that she feels that ointment is not helping. Per elise payne c/c ointment and continue estrace and keflex as prescribed. Patient became tearful on phone and states that she needs ointment for relief and is asking that we call in a different ointment. Please adivse. KW

## 2021-02-16 NOTE — Telephone Encounter (Signed)
Reason for Disposition  [1] Painful urination AND [2] EITHER frequency or urgency AND [3] has on-call doctor  Answer Assessment - Initial Assessment Questions 1. SEVERITY: "How bad is the pain?"  (e.g., Scale 1-10; mild, moderate, or severe)   - MILD (1-3): complains slightly about urination hurting   - MODERATE (4-7): interferes with normal activities     - SEVERE (8-10): excruciating, unwilling or unable to urinate because of the pain      Seen on 02/12/2021 for UTI.   Meds given.   I'm still burning real bad.   The cream is not working.  When pee it's not as bad.   Surrounding area down there is so sore and burning.   2. FREQUENCY: "How many times have you had painful urination today?"      *No Answer* 3. PATTERN: "Is pain present every time you urinate or just sometimes?"      *No Answer* 4. ONSET: "When did the painful urination start?"      *No Answer* 5. FEVER: "Do you have a fever?" If Yes, ask: "What is your temperature, how was it measured, and when did it start?"     *No Answer* 6. PAST UTI: "Have you had a urine infection before?" If Yes, ask: "When was the last time?" and "What happened that time?"      *No Answer* 7. CAUSE: "What do you think is causing the painful urination?"  (e.g., UTI, scratch, Herpes sore)     *No Answer* 8. OTHER SYMPTOMS: "Do you have any other symptoms?" (e.g., flank pain, vaginal discharge, genital sores, urgency, blood in urine)     *No Answer* 9. PREGNANCY: "Is there any chance you are pregnant?" "When was your last menstrual period?"     *No Answer*  Protocols used: Urination Pain - Female-A-AH  Chief Complaint: Seen on 02/12/2021 by Merita Norton.   Prescribed antibiotics.   UTI symptoms are much better but very sore, burning and irritated around her bladder on the outside area Symptoms: burning all the time, sore, irritated Frequency: all the time Pertinent Negatives: Patient denies difficulty urinating.   Disposition: [] ED /[] Urgent Care (no  appt availability in office) / [] Appointment(In office/virtual)/ []  [facility] [facility]/ [] Home Care/ [] Refused Recommended Disposition /[] [facility]/ [x]  Follow-up with PCP Additional Notes: The steroid cream she was given is not working.   She is wondering if she is allergic to it.  Can something else be called in?  Other symptoms from the UTI are much better.  She would like a call back.

## 2021-02-17 ENCOUNTER — Other Ambulatory Visit: Payer: Self-pay | Admitting: Family Medicine

## 2021-02-17 DIAGNOSIS — Z87440 Personal history of urinary (tract) infections: Secondary | ICD-10-CM

## 2021-02-17 DIAGNOSIS — R102 Pelvic and perineal pain: Secondary | ICD-10-CM

## 2021-02-18 ENCOUNTER — Other Ambulatory Visit: Payer: Self-pay

## 2021-02-18 ENCOUNTER — Ambulatory Visit: Payer: Medicare HMO | Admitting: Obstetrics and Gynecology

## 2021-02-18 ENCOUNTER — Encounter: Payer: Self-pay | Admitting: Obstetrics and Gynecology

## 2021-02-18 VITALS — BP 144/70 | Ht 65.0 in | Wt 172.0 lb

## 2021-02-18 DIAGNOSIS — N949 Unspecified condition associated with female genital organs and menstrual cycle: Secondary | ICD-10-CM

## 2021-02-18 DIAGNOSIS — Z1231 Encounter for screening mammogram for malignant neoplasm of breast: Secondary | ICD-10-CM

## 2021-02-18 DIAGNOSIS — R300 Dysuria: Secondary | ICD-10-CM

## 2021-02-18 NOTE — Patient Instructions (Signed)
I value your feedback and you entrusting [place] with your care. If you get a [facility] patient survey, I would appreciate you taking the time to let us know about your experience today. Thank you!  HEALTHY VAGINAL HYGIENE  AVOID   Panytyhose Synthetic underwear (wear COTTON underwear)  Tight pants/jeans Thongs Pantyliners Scented soaps/shower gels (use Dove Sensitive Skin soap or water to clean) Bubble bath/bath bombs Scented detergents  ALL dryer sheets (line dry underwear if using them on your other clothing) Feminine sprays/douches

## 2021-02-18 NOTE — Telephone Encounter (Signed)
Patient has appointment today with GYN and also is scheduled to see the Urologist.

## 2021-02-18 NOTE — Progress Notes (Signed)
Mikey Kirschner, PA-C   Chief Complaint  Patient presents with   Vaginal Irritation    No discharge, odor or itchiness    HPI:      [name] is a 71 y.o. G2P2 whose LMP was No LMP recorded. Patient is postmenopausal., presents today for NP eval of vaginal burning that started about 3 wks ago, no d/c, or odor; referred by PCP. Hurt to walk and sit. Pt thought UTI due to dysuria/burning (no other UTI sx) and was treated with bactrim. Sx persisted so went to PCP and was given keflex while waiting for repeat C&S. C&S negative. Pt also given triamcinolone oint which caused extra vaginal burning, so pt stopped using it. Was given oral estradiol 0.5 mg to take daily and has been doing so for about a week. Pt states burning/dysuria sx are improving; still no other UTI sx. Had been using new feminine wipes prior to sx but has since stopped them. Also changed soaps before sx so has gone back to [employer]. Not using dryer sheets, not wearing pads.  No PMB. Pt is sex active, but hasn't been since these sx. Does have some vag dryness during sex, improved with lubricants.  Pap no longer indicated. 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    Family History  Problem Relation Age of Onset   Cancer Father        lung   Arthritis Mother    Heart disease Sister        quadriple by pass   Heart disease Brother    OCD Son     Social History   Socioeconomic  History   Marital status: Married    Spouse name: Not on file   Number of children: Not on file   Years of education: Not on file   Highest education level: Not on file  Occupational History   Not on file  Tobacco Use   Smoking status: Never   Smokeless tobacco: Never  Vaping Use   Vaping Use: Never used  Substance and Sexual Activity   Alcohol use: No   Drug use: No   Sexual activity: Yes    Birth control/protection: Post-menopausal  Other Topics Concern   Not on file  Social History Narrative   Not on file   Social Determinants of Health   Financial Resource Strain: Not on file  Food Insecurity: Not on file  Transportation Needs: Not on file  Physical Activity: Not on file  Stress: Not on file  Social Connections: Not on file  Intimate Partner Violence: Not on file    Outpatient Medications Prior to Visit  Medication Sig Dispense Refill   Alirocumab (PRALUENT) 150 MG/ML SOAJ Inject into the skin.     amLODipine (NORVASC) 5 MG tablet Take 1 tablet by mouth daily.     aspirin 81 MG chewable tablet Chew 81 mg by mouth.  cephALEXin (KEFLEX) 500 MG capsule Take 1 capsule (500 mg total) by mouth 3 (three) times daily. 6am, 2p, 10pm 30 capsule 0   hydrochlorothiazide (HYDRODIURIL) 25 MG tablet hydrochlorothiazide 25 mg tablet  TAKE 1 TABLET BY MOUTH EVERY DAY     metoprolol succinate (TOPROL-XL) 25 MG 24 hr tablet Take 12.5 mg by mouth.     nitroGLYCERIN (NITROSTAT) 0.4 MG SL tablet      telmisartan (MICARDIS) 20 MG tablet Take 1 tablet by mouth daily.     estradiol (ESTRACE) 0.5 MG tablet Take 1 tablet (0.5 mg total) by mouth daily. 30 tablet 0   triamcinolone ointment (KENALOG) 0.5 % Apply 1 application topically 2 (two) times daily. (Patient not taking: Reported on 02/18/2021) 30 g 0   No facility-administered medications prior to visit.      ROS:  Review of Systems  Constitutional:  Negative for fever.  Gastrointestinal:  Negative for blood in stool,  constipation, diarrhea, nausea and vomiting.  Genitourinary:  Positive for dysuria and vaginal pain. Negative for dyspareunia, flank pain, frequency, hematuria, urgency, vaginal bleeding and vaginal discharge.  Musculoskeletal:  Negative for back pain.  Skin:  Negative for rash.  BREAST: No symptoms   OBJECTIVE:   Vitals:  BP (!) 144/70    Ht 5\' 5"  (1.651 m)    Wt 172 lb (78 kg)    BMI 28.62 kg/m   Physical Exam Vitals reviewed.  Constitutional:      Appearance: She is well-developed.  Pulmonary:     Effort: Pulmonary effort is normal.  Genitourinary:    General: Normal vulva.     Pubic Area: No rash.      Labia:        Right: Tenderness present. No rash or lesion.        Left: No rash, tenderness or lesion.      Vagina: Normal. No vaginal discharge, erythema or tenderness.     Cervix: Normal.     Uterus: Normal. Not enlarged and not tender.      Adnexa: Right adnexa normal and left adnexa normal.       Right: No mass or tenderness.         Left: No mass or tenderness.      Musculoskeletal:        General: Normal range of motion.     Cervical back: Normal range of motion.  Skin:    General: Skin is warm and dry.  Neurological:     General: No focal deficit present.     Mental Status: She is alert and oriented to person, place, and time.  Psychiatric:        Mood and Affect: Mood normal.        Behavior: Behavior normal.        Thought Content: Thought content normal.        Judgment: Judgment normal.    Results: Results for orders placed or performed in visit on 02/18/21 (from the past 24 hour(s))  POCT Wet Prep with KOH     Status: Normal   Collection Time: 02/19/21 12:00 PM  Result Value Ref Range   Trichomonas, UA Negative    Clue Cells Wet Prep HPF POC neg    Epithelial Wet Prep HPF POC     Yeast Wet Prep HPF POC neg    Bacteria Wet Prep HPF POC     RBC Wet Prep HPF POC     WBC Wet Prep HPF POC  KOH Prep POC Negative Negative  POCT Urinalysis  Dipstick     Status: Normal   Collection Time: 02/19/21 12:00 PM  Result Value Ref Range   Color, UA yellow    Clarity, UA clear    Glucose, UA Negative Negative   Bilirubin, UA neg    Ketones, UA neg    Spec Grav, UA 1.025 1.010 - 1.025   Blood, UA neg    pH, UA 5.0 5.0 - 8.0   Protein, UA Negative Negative   Urobilinogen, UA     Nitrite, UA neg    Leukocytes, UA Negative Negative   Appearance     Odor       Assessment/Plan: Vaginal burning - Plan: NuSwab Vaginitis (VG), POCT Wet Prep with KOH; neg exam, neg wet prep. Check vag culture. Will f/u with results. Sx most likely due to chem derm due to feminine wipes; sx improving with cessation of wipes. 1 sample prem vag crm to use pea size amt externally QHS for 1 wk. D/C oral estradiol. If culture neg and sx resolved, f/u prn.   Dysuria - Plan: POCT Urinalysis Dipstick; neg UA. Given sx hx, doubt UTI all along and sx vag related  Encounter for screening mammogram for malignant neoplasm of breast - Plan: MM 3D SCREEN BREAST BILATERAL; pt to [facility] since past due.     Meds ordered this encounter  Medications   conjugated estrogens (PREMARIN) vaginal cream    Sig: AAA externally vaginally QHS for 1 wk. 1-4 g SAMPLE GIVEN    Dispense:  30 g    Refill:  0    Order Specific Question:   Supervising Provider    Answer:   Gae Dry [id number]      Return if symptoms worsen or fail to improve.  Kristoffer Bala B. Branon Sabine, PA-C 02/19/2021 12:02 PM

## 2021-02-18 NOTE — Progress Notes (Signed)
See other telephone encounter.

## 2021-02-19 LAB — POCT URINALYSIS DIPSTICK
Bilirubin, UA: NEGATIVE
Blood, UA: NEGATIVE
Glucose, UA: NEGATIVE
Ketones, UA: NEGATIVE
Leukocytes, UA: NEGATIVE
Nitrite, UA: NEGATIVE
Protein, UA: NEGATIVE
Spec Grav, UA: 1.025 (ref 1.010–1.025)
pH, UA: 5 (ref 5.0–8.0)

## 2021-02-19 LAB — POCT WET PREP WITH KOH
Clue Cells Wet Prep HPF POC: NEGATIVE
KOH Prep POC: NEGATIVE
Trichomonas, UA: NEGATIVE
Yeast Wet Prep HPF POC: NEGATIVE

## 2021-02-19 MED ORDER — PREMARIN 0.625 MG/GM VA CREA: TOPICAL_CREAM | VAGINAL | 0 refills | Status: DC

## 2021-02-21 LAB — NUSWAB VAGINITIS (VG)
Candida albicans, NAA: NEGATIVE
Candida glabrata, NAA: NEGATIVE
Trich vag by NAA: NEGATIVE

## 2021-02-22 NOTE — Progress Notes (Signed)
Pls let pt know culture neg for any vaginal infection. Sx should cont to improve. F/u prn. Thx

## 2021-02-23 NOTE — Progress Notes (Signed)
Pt aware. Did say her sx have improved.

## 2021-03-11 ENCOUNTER — Encounter: Payer: Self-pay | Admitting: Urology

## 2021-03-11 ENCOUNTER — Other Ambulatory Visit: Payer: Self-pay

## 2021-03-11 ENCOUNTER — Ambulatory Visit: Payer: Medicare HMO | Admitting: Urology

## 2021-03-11 VITALS — BP 153/104 | HR 85 | Ht 64.0 in | Wt 170.0 lb

## 2021-03-11 DIAGNOSIS — R102 Pelvic and perineal pain: Secondary | ICD-10-CM | POA: Diagnosis not present

## 2021-03-11 DIAGNOSIS — N390 Urinary tract infection, site not specified: Secondary | ICD-10-CM | POA: Diagnosis not present

## 2021-03-11 MED ORDER — ESTRADIOL 0.1 MG/GM VA CREA: TOPICAL_CREAM | VAGINAL | 12 refills | Status: DC

## 2021-03-11 NOTE — Progress Notes (Signed)
° °  03/11/21 1:55 PM   Sheila Herrera 1950-09-26 [id number]  CC: Vaginal burning  HPI: 71 year old female referred for evaluation of vaginal burning.  She denies any dysuria or any urinary symptoms.  Denies any incontinence.  No recent UTIs, recent urine culture negative.  She had a GYN exam 3 weeks ago which was benign, and she was started on topical estrogen cream and given samples.  She denies any significant improvement in the vaginal pain since then.  Sex is painful.  She denies any gross hematuria.  Symptoms seem to be worse when moving around and physically active.  Urinalysis today contaminated with greater than 10 squamous cells, 6-10 WBCs, 0-2 RBCs, many bacteria, no yeast, nitrite negative, 1+ leukocytes.    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 She has never used smokeless tobacco. She reports that she does not drink alcohol and does not use drugs.  Physical Exam: BP (!) 153/104    Pulse 85    Ht 5\' 4"  (1.626 m)    Wt 170 lb (77.1 kg)    BMI 29.18 kg/m    Constitutional:  Alert and oriented, No acute distress. Cardiovascular: No clubbing, cyanosis, or edema. Respiratory: Normal respiratory effort, no increased work of breathing. GI: Abdomen is soft, nontender, nondistended, no abdominal masses   Assessment & Plan:   71 year old female with vaginal pain likely secondary to vaginal atrophy/genitourinary syndrome of menopause.  Patient information provided and I recommended continuing the topical estrogen cream and a prescription was provided.  We also discussed the  use of lubricants.  RTC with PA 6 weeks symptom check.  She denies any urinary symptoms or dysuria, and isolated chronic vaginal pain is outside the scope of my practice as a urologist, and she may benefit from referral to a different gynecologist if persistent symptoms despite topical estrogen cream over the next few months  Prescription for topical estrogen cream RTC 6 weeks symptom check  Nickolas Madrid, MD 03/11/2021  [place] [address] [place], [place] [address] [phone]

## 2021-03-11 NOTE — Patient Instructions (Signed)
°  Vaginal atrophy (atrophic vaginitis)(genitourinary syndrome of menopause)  is thinning, drying and inflammation of the vaginal walls that may occur when your body has less estrogen. Vaginal atrophy occurs most often after menopause.  For many women, vaginal atrophy not only makes intercourse painful but also leads to distressing urinary symptoms. Because the condition causes both vaginal and urinary symptoms, doctors use the term "genitourinary syndrome of menopause (GSM)" to describe vaginal atrophy and its accompanying symptoms.  Simple, effective treatments for GSM are available. Reduced estrogen levels result in changes to your body, but it doesn't mean you have to live with the discomfort of GSM.  Symptoms Genitourinary syndrome of menopause (GSM) signs and symptoms may include:  Vaginal dryness Vaginal burning Vaginal discharge Genital itching Burning with urination Urgency with urination Frequent urination Recurrent urinary tract infections Urinary incontinence Light bleeding after intercourse Discomfort with intercourse Decreased vaginal lubrication during sexual activity Shortening and tightening of the vaginal canal   Many postmenopausal women experience GSM. But few seek treatment. Women may be embarrassed to discuss their symptoms with their doctor and may resign themselves to living with these symptoms.  Also make an appointment to see your doctor if you experience painful intercourse that's not resolved by using a vaginal moisturizer (K-Y Liquibeads, Replens, Sliquid, others) or water-based lubricant (Astroglide, K-Y Jelly, Sliquid, others).  The best treatment is topical estrogen cream, but this may take a few weeks to month to start working

## 2021-03-12 LAB — URINALYSIS, COMPLETE
Bilirubin, UA: NEGATIVE
Glucose, UA: NEGATIVE
Nitrite, UA: NEGATIVE
Specific Gravity, UA: >1.03 — ABNORMAL HIGH (ref 1.005–1.030)
Urobilinogen, Ur: 1 mg/dL (ref 0.2–1.0)
pH, UA: 5.5 (ref 5.0–7.5)

## 2021-03-12 LAB — MICROSCOPIC EXAMINATION: Epithelial Cells (non renal): >10 /hpf — AB (ref 0–10)

## 2021-03-16 DIAGNOSIS — I240 Acute coronary thrombosis not resulting in myocardial infarction: Principal | ICD-10-CM

## 2021-03-16 DIAGNOSIS — I259 Chronic ischemic heart disease, unspecified: Principal | ICD-10-CM

## 2021-03-16 MED ORDER — PRALUENT PEN 150 MG/ML SUBCUTANEOUS PEN INJECTOR
SUBCUTANEOUS | 3 refills | 84 days | Status: CP
Start: 2021-03-16 — End: ?

## 2021-03-30 ENCOUNTER — Other Ambulatory Visit: Payer: Self-pay | Admitting: Family Medicine

## 2021-03-30 DIAGNOSIS — N9089 Other specified noninflammatory disorders of vulva and perineum: Secondary | ICD-10-CM

## 2021-03-30 DIAGNOSIS — N898 Other specified noninflammatory disorders of vagina: Secondary | ICD-10-CM

## 2021-03-31 NOTE — Telephone Encounter (Signed)
Not on current med list. Requested Prescriptions  Pending Prescriptions Disp Refills   estradiol (ESTRACE) 0.5 MG tablet [Pharmacy Med Name: ESTRADIOL 0.5 MG TABLET] 30 tablet 0    Sig: TAKE 1 TABLET BY MOUTH EVERY DAY     OB/GYN:  Estrogens Failed - 03/30/2021 10:52 AM      Failed - Mammogram is up-to-date per Health Maintenance      Failed - Last BP in normal range    BP Readings from Last 1 Encounters:  03/11/21 (!) 153/104         Passed - Valid encounter within last 12 months    Recent Outpatient Visits          1 month ago [place] Tally Joe T, FNP   4 months ago Coronary artery disease involving native coronary artery of native heart with unstable angina pectoris [facility])   [facility] Jerrol Banana., MD   4 years ago Urinary tract infection without hematuria, site unspecified   [facility], [place], [place]   5 years ago Myocardial infarction involving left anterior descending (LAD) coronary artery [facility])   [facility] [place], [place], [place]   5 years ago Acute bacterial conjunctivitis, bilateral   [facility] [place], [place], [place]      Future Appointments            In 2 months Drubel, Ria Comment, PA-C [employer], [facility]            triamcinolone ointment (KENALOG) 0.5 % [Pharmacy Med Name: TRIAMCINOLONE 0.5% OINTMENT] 30 g 0    Sig: APPLY TO AFFECTED AREA TWICE A DAY     Not Delegated - Dermatology:  Corticosteroids Failed - 03/30/2021 10:52 AM      Failed - This refill cannot be delegated      Passed - Valid encounter within last 12 months    Recent Outpatient Visits          1 month ago [place] Tally Joe T, FNP   4 months ago Coronary artery disease involving native coronary artery of native heart with unstable angina pectoris [facility])   [facility] Jerrol Banana., MD   4 years  ago Urinary tract infection without hematuria, site unspecified   [facility], [place], [place]   5 years ago Myocardial infarction involving left anterior descending (LAD) coronary artery [facility])   [facility] [place], [place], [place]   5 years ago Acute bacterial conjunctivitis, bilateral   [facility] [place], [place], [place]      Future Appointments            In 2 months Thedore Mins, Ria Comment, PA-C [employer], [place]

## 2021-03-31 NOTE — Telephone Encounter (Signed)
Requested medication (s) are due for refill today: no  Requested medication (s) are on the active medication list: no  Future visit scheduled: 05/2421  Notes to clinic:  Med is not on current list, however, it is a not delegated med, please assess.      Requested Prescriptions  Pending Prescriptions Disp Refills   triamcinolone ointment (KENALOG) 0.5 % [Pharmacy Med Name: TRIAMCINOLONE 0.5% OINTMENT] 30 g 0    Sig: APPLY TO AFFECTED AREA TWICE A DAY     Not Delegated - Dermatology:  Corticosteroids Failed - 03/30/2021 10:52 AM      Failed - This refill cannot be delegated      Passed - Valid encounter within last 12 months    Recent Outpatient Visits           1 month ago [place] Tally Joe T, FNP   4 months ago Coronary artery disease involving native coronary artery of native heart with unstable angina pectoris [facility])   [facility] Jerrol Banana., MD   4 years ago Urinary tract infection without hematuria, site unspecified   [facility], [place], PA-C   5 years ago Myocardial infarction involving left anterior descending (LAD) coronary artery [facility])   [facility] [place], [place], [place]   5 years ago Acute bacterial conjunctivitis, bilateral   [facility] [place], [place], [place]       Future Appointments             In 2 months Drubel, Ria Comment, PA-C [employer], [facility]            Refused Prescriptions Disp Refills   estradiol (ESTRACE) 0.5 MG tablet [employer] Med Name: ESTRADIOL 0.5 MG TABLET] 30 tablet 0    Sig: TAKE 1 TABLET BY MOUTH EVERY DAY     OB/GYN:  Estrogens Failed - 03/30/2021 10:52 AM      Failed - Mammogram is up-to-date per Health Maintenance      Failed - Last BP in normal range    BP Readings from Last 1 Encounters:  03/11/21 (!) 153/104          Passed - Valid encounter within last 12 months    Recent Outpatient  Visits           1 month ago [place] Tally Joe T, FNP   4 months ago Coronary artery disease involving native coronary artery of native heart with unstable angina pectoris [facility])   [facility] Jerrol Banana., MD   4 years ago Urinary tract infection without hematuria, site unspecified   [facility], [place], [place]   5 years ago Myocardial infarction involving left anterior descending (LAD) coronary artery [facility])   [facility] [place], [place], [place]   5 years ago Acute bacterial conjunctivitis, bilateral   [facility] [place], [place], [place]       Future Appointments             In 2 months Thedore Mins, Ria Comment, PA-C [employer], [place]

## 2021-04-22 ENCOUNTER — Other Ambulatory Visit: Payer: Self-pay

## 2021-04-22 ENCOUNTER — Ambulatory Visit: Payer: Medicare HMO | Admitting: Urology

## 2021-04-22 ENCOUNTER — Encounter: Payer: Self-pay | Admitting: Urology

## 2021-04-22 VITALS — BP 118/70 | HR 73 | Ht 64.0 in | Wt 170.0 lb

## 2021-04-22 DIAGNOSIS — N952 Postmenopausal atrophic vaginitis: Secondary | ICD-10-CM

## 2021-04-27 DIAGNOSIS — H524 Presbyopia: Secondary | ICD-10-CM | POA: Diagnosis not present

## 2021-05-01 NOTE — Progress Notes (Signed)
? ? ?04/22/2021 ?9:55 AM  ? ?Sheila Herrera ?1951-01-23 ?[id number] ? ?Referring provider: Mikey Kirschner, PA-C ?[id number] ?[place],  [place] [address] ? ?Chief Complaint  ?Patient presents with  ? Follow-up  ? ? ?HPI: ?Sheila Herrera is a 71 y.o. female who presents today for recheck on vaginal pain after starting vaginal estrogen cream. ? ?She was seen on 03/11/2021 by Dr. Diamantina Providence for issues of vaginal pain.  She was instructed to continue the vaginal estrogen cream nightly and to follow up in 6 weeks with me. ? ?She has been applying the vaginal estrogen cream nightly and this has relieved her vaginal burning.  ? ?Patient denies any modifying or aggravating factors.  Patient denies any gross hematuria, dysuria or suprapubic/flank pain.  Patient denies any fevers, chills, nausea or vomiting.   ? ?PMH: ?No past medical history on file. ? ?Surgical History: ?Past Surgical History:  ?Procedure Laterality Date  ? CARDIAC SURGERY    ? open heart surgery 1958  ? VEIN SURGERY    ? vein graph in leg due to open heart surgery, 1966  ? ? ?Home Medications:  ?Allergies as of 04/22/2021   ?No Known Allergies ?  ? ?  ?Medication List  ?  ? ?  ? Accurate as of April 22, 2021 11:59 PM. If you have any questions, ask your nurse or doctor.  ?  ?  ? ?  ? ?STOP taking these medications   ? ?cephALEXin 500 MG capsule ?Commonly known as: Keflex ?Stopped by: Zara Council, PA-C ?  ? ?  ? ?TAKE these medications   ? ?amLODipine 5 MG tablet ?Commonly known as: NORVASC ?Take 1 tablet by mouth daily. ?  ?aspirin 81 MG chewable tablet ?Chew 81 mg by mouth. ?  ?estradiol 0.1 MG/GM vaginal cream ?Commonly known as: ESTRACE ?Estrogen Cream Instruction Discard applicator Apply pea sized amount to tip of finger to urethra before bed. Wash hands well after application. Use Monday, Wednesday and Friday ?  ?hydrochlorothiazide 25 MG tablet ?Commonly known as: HYDRODIURIL ?hydrochlorothiazide 25 mg tablet ? TAKE 1 TABLET BY MOUTH  EVERY DAY ?  ?metoprolol succinate 25 MG 24 hr tablet ?Commonly known as: TOPROL-XL ?Take 12.5 mg by mouth. ?  ?nitroGLYCERIN 0.4 MG SL tablet ?Commonly known as: NITROSTAT ?  ?Praluent 150 MG/ML Soaj ?Generic drug: Alirocumab ?Inject into the skin. ?  ?telmisartan 20 MG tablet ?Commonly known as: MICARDIS ?Take 1 tablet by mouth daily. ?  ? ?  ? ? ?Allergies: No Known Allergies ? ?Family History: ?Family History  ?Problem Relation Age of Onset  ? Cancer Father   ?     lung  ? Arthritis Mother   ? Heart disease Sister   ?     quadriple by pass  ? Heart disease Brother   ? OCD Son   ? ? ?Social History:  reports that she has never smoked. She has never used smokeless tobacco. She reports that she does not drink alcohol and does not use drugs. ? ?ROS: ?Pertinent ROS in HPI ? ?Physical Exam: ?BP 118/70   Pulse 73   Ht 5' 4"  (1.626 m)   Wt 170 lb (77.1 kg)   BMI 29.18 kg/m?   ?Constitutional:  Well nourished. Alert and oriented, No acute distress. ?HEENT: [place] AT, mask in place Trachea midline ?Cardiovascular: No clubbing, cyanosis, or edema. ?Respiratory: Normal respiratory effort, no increased work of breathing. ?Neurologic: Grossly intact, no focal deficits, moving all 4 extremities. ?Psychiatric: Normal  mood and affect.   ? ?Laboratory Data: ?WBC 3.6 - 11.2 10*9/L 4.7   ?RBC 3.95 - 5.13 10*12/L 4.82   ?HGB 11.3 - 14.9 g/dL 14.2   ?HCT 34.0 - 44.0 % 42.8   ?MCV 77.6 - 95.7 fL 88.9   ?MCH 25.9 - 32.4 pg 29.5   ?MCHC 32.0 - 36.0 g/dL 33.2   ?RDW 12.2 - 15.2 % 14.1   ?MPV 6.8 - 10.7 fL 8.2   ?Platelet 150 - 450 10*9/L 263   ?Resulting Agency  [facility]  ?Specimen Collected: 11/26/20 07:48 Last Resulted: 11/26/20 08:18  ?Received From: [facility]  Result Received: 02/11/21 09:42  ? ?Sodium 135 - 145 mmol/L 138   ?Potassium 3.4 - 4.8 mmol/L 3.4   ?Chloride 98 - 107 mmol/L 102   ?CO2 20.0 - 31.0 mmol/L 27.0   ?Anion Gap 5 - 14 mmol/L 9   ?BUN 9 - 23 mg/dL 12   ?Creatinine 0.60 - 0.80 mg/dL  0.74   ?BUN/Creatinine Ratio  16   ?eGFR CKD-EPI (2021) Female >=60 mL/min/1.74m 88   ?Comment: eGFR calculated with CKD-EPI 2021 equation in accordance with [employer] Task Force recommendations.  ?Glucose 70 - 179 mg/dL 132   ?Calcium 8.7 - 10.4 mg/dL 10.0   ?Resulting Agency  [facility] ?Specimen Collected: 11/26/20 07:48 Last Resulted: 11/26/20 08:57  ?Received From: [facility] Result Received: 02/11/21 09:42  ? ?Hemoglobin A1C 4.8 - 5.6 % 6.9 High    ?Estimated Average Glucose mg/dL 151   ?Resulting Agency  [facility] ?Narrative ?Performed by [facility] ?Screening or Diagnosis of Diabetes Mellitus*  ?A1c Reference Interval        Interpretation  ?4.8 - 5.6                     Normal  ?5.7 - 6.4                     Dysglycemia  ?>6.4                          Diabetes Mellitus  ? ?*Not recommended for diagnosis of diabetes in children with Cystic Fibrosis or with symptoms suggestive of acute onset type 1 diabetes.  ? ? ?A1c Glycemic Goal: <7.0 %  ? ?**Goals should be individualized; more or less stringent A1c glycemic goals may be appropriate for individual patients.  ?(Adopted from: 2020 ADA Standards of Medical Care In Diabetes)  ?Specimen Collected: 11/25/20 07:50 Last Resulted: 11/25/20 17:26  ?Received From: [facility] Result Received: 11/26/20 15:43  ?I have reviewed the labs. ? ? ?Pertinent Imaging: ?N/A ? ?Assessment & Plan:   ? ?1. Vaginal atrophy ?-continue the vaginal estrogen cream to three nights weekly  ? ?Return in about 1 year (around 04/23/2022) for symptom recheck . ? ?These notes generated with voice recognition software. I apologize for typographical errors. ? ?Tiffiny Worthy, PA-C ? ?[facility]?[address] [place] [id number]?[phone]?  ?

## 2021-05-20 ENCOUNTER — Ambulatory Visit (INDEPENDENT_AMBULATORY_CARE_PROVIDER_SITE_OTHER): Payer: Medicare HMO

## 2021-05-20 VITALS — Wt 170.0 lb

## 2021-05-20 DIAGNOSIS — Z0000 Encounter for general adult medical examination without abnormal findings: Secondary | ICD-10-CM | POA: Diagnosis not present

## 2021-05-20 NOTE — Patient Instructions (Addendum)
Ms. Gilster , ?Thank you for taking time to come for your Medicare Wellness Visit. I appreciate your ongoing commitment to your health goals. Please review the following plan we discussed and let me know if I can assist you in the future.  ? ?Screening recommendations/referrals: ?Colonoscopy: ins going to send her a cologuard to do ?Mammogram: referral sent ?Bone Density: n/d ?Recommended yearly ophthalmology/optometry visit for glaucoma screening and checkup ?Recommended yearly dental visit for hygiene and checkup ? ?Vaccinations: ?Influenza vaccine: 11/26/20 ?Pneumococcal vaccine: n/d ?Tdap vaccine: 05/14/15 ?Shingles vaccine: Zostavax 05/14/15   ?Covid-19:n/d ? ?Advanced directives: no ? ?Conditions/risks identified: none ? ?Next appointment: Follow up in one year for your annual wellness visit - 05/25/22 @ 10:45am by phone ? ? ?Preventive Care 68 Years and Older, Female ?Preventive care refers to lifestyle choices and visits with your health care provider that can promote health and wellness. ?What does preventive care include? ?A yearly physical exam. This is also called an annual well check. ?Dental exams once or twice a year. ?Routine eye exams. Ask your health care provider how often you should have your eyes checked. ?Personal lifestyle choices, including: ?Daily care of your teeth and gums. ?Regular physical activity. ?Eating a healthy diet. ?Avoiding tobacco and drug use. ?Limiting alcohol use. ?Practicing safe sex. ?Taking low-dose aspirin every day. ?Taking vitamin and mineral supplements as recommended by your health care provider. ?What happens during an annual well check? ?The services and screenings done by your health care provider during your annual well check will depend on your age, overall health, lifestyle risk factors, and family history of disease. ?Counseling  ?Your health care provider may ask you questions about your: ?Alcohol use. ?Tobacco use. ?Drug use. ?Emotional well-being. ?Home and  relationship well-being. ?Sexual activity. ?Eating habits. ?History of falls. ?Memory and ability to understand (cognition). ?Work and work Astronomer. ?Reproductive health. ?Screening  ?You may have the following tests or measurements: ?Height, weight, and BMI. ?Blood pressure. ?Lipid and cholesterol levels. These may be checked every 5 years, or more frequently if you are over 63 years old. ?Skin check. ?Lung cancer screening. You may have this screening every year starting at age 48 if you have a 30-pack-year history of smoking and currently smoke or have quit within the past 15 years. ?Fecal occult blood test (FOBT) of the stool. You may have this test every year starting at age 43. ?Flexible sigmoidoscopy or colonoscopy. You may have a sigmoidoscopy every 5 years or a colonoscopy every 10 years starting at age 8. ?Hepatitis C blood test. ?Hepatitis B blood test. ?Sexually transmitted disease (STD) testing. ?Diabetes screening. This is done by checking your blood sugar (glucose) after you have not eaten for a while (fasting). You may have this done every 1-3 years. ?Bone density scan. This is done to screen for osteoporosis. You may have this done starting at age 92. ?Mammogram. This may be done every 1-2 years. Talk to your health care provider about how often you should have regular mammograms. ?Talk with your health care provider about your test results, treatment options, and if necessary, the need for more tests. ?Vaccines  ?Your health care provider may recommend certain vaccines, such as: ?Influenza vaccine. This is recommended every year. ?Tetanus, diphtheria, and acellular pertussis (Tdap, Td) vaccine. You may need a Td booster every 10 years. ?Zoster vaccine. You may need this after age 63. ?Pneumococcal 13-valent conjugate (PCV13) vaccine. One dose is recommended after age 49. ?Pneumococcal polysaccharide (PPSV23) vaccine. One dose is recommended  after age 22. ?Talk to your health care provider  about which screenings and vaccines you need and how often you need them. ?This information is not intended to replace advice given to you by your health care provider. Make sure you discuss any questions you have with your health care provider. ?Document Released: 02/21/2015 Document Revised: 10/15/2015 Document Reviewed: 11/26/2014 ?Elsevier Interactive Patient Education ? 2017 [employer]. ? ?Fall Prevention in the Home ?Falls can cause injuries. They can happen to people of all ages. There are many things you can do to make your home safe and to help prevent falls. ?What can I do on the outside of my home? ?Regularly fix the edges of walkways and driveways and fix any cracks. ?Remove anything that might make you trip as you walk through a door, such as a raised step or threshold. ?Trim any bushes or trees on the path to your home. ?Use bright outdoor lighting. ?Clear any walking paths of anything that might make someone trip, such as rocks or tools. ?Regularly check to see if handrails are loose or broken. Make sure that both sides of any steps have handrails. ?Any raised decks and porches should have guardrails on the edges. ?Have any leaves, snow, or ice cleared regularly. ?Use sand or salt on walking paths during winter. ?Clean up any spills in your garage right away. This includes oil or grease spills. ?What can I do in the bathroom? ?Use night lights. ?Install grab bars by the toilet and in the tub and shower. Do not use towel bars as grab bars. ?Use non-skid mats or decals in the tub or shower. ?If you need to sit down in the shower, use a plastic, non-slip stool. ?Keep the floor dry. Clean up any water that spills on the floor as soon as it happens. ?Remove soap buildup in the tub or shower regularly. ?Attach bath mats securely with double-sided non-slip rug tape. ?Do not have throw rugs and other things on the floor that can make you trip. ?What can I do in the bedroom? ?Use night lights. ?Make sure  that you have a light by your bed that is easy to reach. ?Do not use any sheets or blankets that are too big for your bed. They should not hang down onto the floor. ?Have a firm chair that has side arms. You can use this for support while you get dressed. ?Do not have throw rugs and other things on the floor that can make you trip. ?What can I do in the kitchen? ?Clean up any spills right away. ?Avoid walking on wet floors. ?Keep items that you use a lot in easy-to-reach places. ?If you need to reach something above you, use a strong step stool that has a grab bar. ?Keep electrical cords out of the way. ?Do not use floor polish or wax that makes floors slippery. If you must use wax, use non-skid floor wax. ?Do not have throw rugs and other things on the floor that can make you trip. ?What can I do with my stairs? ?Do not leave any items on the stairs. ?Make sure that there are handrails on both sides of the stairs and use them. Fix handrails that are broken or loose. Make sure that handrails are as long as the stairways. ?Check any carpeting to make sure that it is firmly attached to the stairs. Fix any carpet that is loose or worn. ?Avoid having throw rugs at the top or bottom of the stairs. If you  do have throw rugs, attach them to the floor with carpet tape. ?Make sure that you have a light switch at the top of the stairs and the bottom of the stairs. If you do not have them, ask someone to add them for you. ?What else can I do to help prevent falls? ?Wear shoes that: ?Do not have high heels. ?Have rubber bottoms. ?Are comfortable and fit you well. ?Are closed at the toe. Do not wear sandals. ?If you use a stepladder: ?Make sure that it is fully opened. Do not climb a closed stepladder. ?Make sure that both sides of the stepladder are locked into place. ?Ask someone to hold it for you, if possible. ?Clearly mark and make sure that you can see: ?Any grab bars or handrails. ?First and last steps. ?Where the edge of  each step is. ?Use tools that help you move around (mobility aids) if they are needed. These include: ?Canes. ?Walkers. ?Scooters. ?Crutches. ?Turn on the lights when you go into a dark area. Replace any light

## 2021-05-20 NOTE — Progress Notes (Signed)
?Virtual Visit via Telephone Note ? ?I connected with  Sheila Herrera on 05/20/21 at 11:00 AM EDT by telephone and verified that I am speaking with the correct person using two identifiers. ? ?Location: ?Patient: home ?Provider: [facility] ?Persons participating in the virtual visit: patient/Nurse Health Advisor ?  ?I discussed the limitations, risks, security and privacy concerns of performing an evaluation and management service by telephone and the availability of in person appointments. The patient expressed understanding and agreed to proceed. ? ?Interactive audio and video telecommunications were attempted between this nurse and patient, however failed, due to patient having technical difficulties OR patient did not have access to video capability.  We continued and completed visit with audio only. ? ?Some vital signs may be absent or patient reported.  ? ?Hal Hope, LPN ? ?Subjective:  ? GERALYN Herrera is a 71 y.o. female who presents for an Initial Medicare Annual Wellness Visit. ? ?Review of Systems    ? ?  ? ?   ?Objective:  ?  ?There were no vitals filed for this visit. ?There is no height or weight on file to calculate BMI. ? ?   ? View : No data to display.  ?  ?  ?  ? ? ?Current Medications (verified) ?Outpatient Encounter Medications as of 05/20/2021  ?Medication Sig  ? Alirocumab (PRALUENT) 150 MG/ML SOAJ Inject into the skin.  ? amLODipine (NORVASC) 5 MG tablet Take 1 tablet by mouth daily.  ? aspirin 81 MG chewable tablet Chew 81 mg by mouth.  ? estradiol (ESTRACE) 0.1 MG/GM vaginal cream Estrogen Cream Instruction Discard applicator Apply pea sized amount to tip of finger to urethra before bed. Wash hands well after application. Use Monday, Wednesday and Friday  ? hydrochlorothiazide (HYDRODIURIL) 25 MG tablet hydrochlorothiazide 25 mg tablet ? TAKE 1 TABLET BY MOUTH EVERY DAY  ? metoprolol succinate (TOPROL-XL) 25 MG 24 hr tablet Take 12.5 mg by mouth.  ? nitroGLYCERIN (NITROSTAT) 0.4 MG  SL tablet   ? telmisartan (MICARDIS) 20 MG tablet Take 1 tablet by mouth daily.  ? ?No facility-administered encounter medications on file as of 05/20/2021.  ? ? ?Allergies (verified) ?Patient has no known allergies.  ? ?History: ?No past medical history on file. ?Past Surgical History:  ?Procedure Laterality Date  ? CARDIAC SURGERY    ? open heart surgery 1958  ? VEIN SURGERY    ? vein graph in leg due to open heart surgery, 1966  ? ?Family History  ?Problem Relation Age of Onset  ? Cancer Father   ?     lung  ? Arthritis Mother   ? Heart disease Sister   ?     quadriple by pass  ? Heart disease Brother   ? OCD Son   ? ?Social History  ? ?Socioeconomic History  ? Marital status: Married  ?  Spouse name: Not on file  ? Number of children: Not on file  ? Years of education: Not on file  ? Highest education level: Not on file  ?Occupational History  ? Not on file  ?Tobacco Use  ? Smoking status: Never  ? Smokeless tobacco: Never  ?Vaping Use  ? Vaping Use: Never used  ?Substance and Sexual Activity  ? Alcohol use: No  ? Drug use: No  ? Sexual activity: Yes  ?  Birth control/protection: Post-menopausal  ?Other Topics Concern  ? Not on file  ?Social History Narrative  ? Not on file  ? ?Social Determinants  of Health  ? ?Financial Resource Strain: Not on file  ?Food Insecurity: Not on file  ?Transportation Needs: Not on file  ?Physical Activity: Not on file  ?Stress: Not on file  ?Social Connections: Not on file  ? ? ?Tobacco Counseling ?Counseling given: Not Answered ? ? ?Clinical Intake: ? ?Pre-visit preparation completed: Yes ? ?Pain : No/denies pain ? ?  ? ?Nutritional Risks: None ?Diabetes: No ? ?How often do you need to have someone help you when you read instructions, pamphlets, or other written materials from your doctor or pharmacy?: 1 - Never ? ?Diabetic?no ? ?Interpreter Needed?: No ? ?Information entered by :: Kennedy Bucker, LPN ? ? ?Activities of Daily Living ? ?  05/19/2021  ?  7:54 PM  ?In your present state  of health, do you have any difficulty performing the following activities:  ?Hearing? 0  ?Vision? 0  ?Difficulty concentrating or making decisions? 0  ?Walking or climbing stairs? 0  ?Dressing or bathing? 0  ?Doing errands, shopping? 0  ?Preparing Food and eating ? N  ?Using the Toilet? N  ?In the past six months, have you accidently leaked urine? N  ?Do you have problems with loss of bowel control? N  ?Managing your Medications? N  ?Managing your Finances? N  ?Housekeeping or managing your Housekeeping? N  ? ? ?Patient Care Team: ?Burnett Corrente as PCP - General (Physician Assistant) ? ?Indicate any recent Medical Services you may have received from other than [facility] providers in the past year (date may be approximate). ? ?   ?Assessment:  ? This is a routine wellness examination for Sheila Herrera. ? ?Hearing/Vision screen ?No results found. ? ?Dietary issues and exercise activities discussed: ?  ? ? Goals Addressed   ?None ?  ? ?Depression Screen ? ?  05/14/2015  ?  1:55 PM  ?PHQ 2/9 Scores  ?PHQ - 2 Score 0  ?  ?Fall Risk ? ?  05/19/2021  ?  7:54 PM 05/14/2015  ?  1:55 PM  ?Fall Risk   ?Falls in the past year? 0 No  ? ? ?FALL RISK PREVENTION PERTAINING TO THE HOME: ? ?Any stairs in or around the home? Yes  ?If so, are there any without handrails? No  ?Home free of loose throw rugs in walkways, pet beds, electrical cords, etc? Yes  ?Adequate lighting in your home to reduce risk of falls? Yes  ? ?ASSISTIVE DEVICES UTILIZED TO PREVENT FALLS: ? ?Life alert? No  ?Use of a cane, walker or w/c? No  ?Grab bars in the bathroom? No  ?Shower chair or bench in shower? No  ?Elevated toilet seat or a handicapped toilet? No  ? ?Cognitive Function: ?  ?  ?  ? ?Immunizations ?Immunization History  ?Administered Date(s) Administered  ? Influenza,inj,Quad PF,6+ Mos 12/29/2017, 11/26/2020  ? Tdap 05/14/2015  ? Zoster, Live 05/14/2015  ? ? ?TDAP status: Up to date ? ?Flu Vaccine status: Up to date ? ?Pneumococcal vaccine status: Declined,   Education has been provided regarding the importance of this vaccine but patient still declined. Advised may receive this vaccine at local pharmacy or Health Dept. Aware to provide a copy of the vaccination record if obtained from local pharmacy or Health Dept. Verbalized acceptance and understanding.  ? ?Covid-19 vaccine status: Declined, Education has been provided regarding the importance of this vaccine but patient still declined. Advised may receive this vaccine at local pharmacy or Health Dept.or vaccine clinic. Aware to provide a copy of the vaccination  record if obtained from local pharmacy or Health Dept. Verbalized acceptance and understanding. ? ?Qualifies for Shingles Vaccine? Yes   ?Zostavax completed Yes   ?Shingrix Completed?: No.    Education has been provided regarding the importance of this vaccine. Patient has been advised to call insurance company to determine out of pocket expense if they have not yet received this vaccine. Advised may also receive vaccine at local pharmacy or Health Dept. Verbalized acceptance and understanding. ? ?Screening Tests ?Health Maintenance  ?Topic Date Due  ? COVID-19 Vaccine (1) Never done  ? Hepatitis C Screening  Never done  ? COLONOSCOPY (Pts 45-40yrs Insurance coverage will need to be confirmed)  Never done  ? Zoster Vaccines- Shingrix (1 of 2) Never done  ? MAMMOGRAM  03/11/2014  ? Pneumonia Vaccine 55+ Years old (1 - PCV) Never done  ? DEXA SCAN  Never done  ? INFLUENZA VACCINE  09/08/2021  ? TETANUS/TDAP  05/13/2025  ? HPV VACCINES  Aged Out  ? ? ?Health Maintenance ? ?Health Maintenance Due  ?Topic Date Due  ? COVID-19 Vaccine (1) Never done  ? Hepatitis C Screening  Never done  ? COLONOSCOPY (Pts 45-45yrs Insurance coverage will need to be confirmed)  Never done  ? Zoster Vaccines- Shingrix (1 of 2) Never done  ? MAMMOGRAM  03/11/2014  ? Pneumonia Vaccine 47+ Years old (1 - PCV) Never done  ? DEXA SCAN  Never done  ? ? ? ? ?Mammogram status: Ordered  02/18/21. Pt provided with contact info and advised to call to schedule appt.  ? ? ?Lung Cancer Screening: (Low Dose CT Chest recommended if Age 85-80 years, 30 pack-year currently smoking OR have quit w/in 1

## 2021-05-21 DIAGNOSIS — H16141 Punctate keratitis, right eye: Secondary | ICD-10-CM | POA: Diagnosis not present

## 2021-06-01 ENCOUNTER — Encounter: Payer: Medicare HMO | Admitting: Physician Assistant

## 2021-06-02 DIAGNOSIS — R42 Dizziness and giddiness: Secondary | ICD-10-CM | POA: Diagnosis not present

## 2021-06-03 ENCOUNTER — Emergency Department
Admission: EM | Admit: 2021-06-03 | Discharge: 2021-06-03 | Disposition: A | Discharge status: Home / Self Care | Payer: Medicare HMO | Attending: Student in an Organized Health Care Education/Training Program | Admitting: Student in an Organized Health Care Education/Training Program

## 2021-06-03 ENCOUNTER — Emergency Department: Payer: Medicare HMO

## 2021-06-03 ENCOUNTER — Other Ambulatory Visit: Payer: Self-pay

## 2021-06-03 DIAGNOSIS — R519 Headache, unspecified: Secondary | ICD-10-CM | POA: Diagnosis not present

## 2021-06-03 DIAGNOSIS — R9431 Abnormal electrocardiogram [ECG] [EKG]: Secondary | ICD-10-CM | POA: Diagnosis not present

## 2021-06-03 DIAGNOSIS — R42 Dizziness and giddiness: Secondary | ICD-10-CM | POA: Insufficient documentation

## 2021-06-03 DIAGNOSIS — R202 Paresthesia of skin: Secondary | ICD-10-CM | POA: Diagnosis not present

## 2021-06-03 DIAGNOSIS — I639 Cerebral infarction, unspecified: Secondary | ICD-10-CM | POA: Insufficient documentation

## 2021-06-03 HISTORY — DX: Essential (primary) hypertension: I10

## 2021-06-03 LAB — DIFFERENTIAL
Abs Immature Granulocytes: 0.01 10*3/uL (ref 0.00–0.07)
Basophils Absolute: 0 10*3/uL (ref 0.0–0.1)
Basophils Relative: 0 %
Eosinophils Absolute: 0.1 10*3/uL (ref 0.0–0.5)
Eosinophils Relative: 2 %
Immature Granulocytes: 0 %
Lymphocytes Relative: 29 %
Lymphs Abs: 1.3 10*3/uL (ref 0.7–4.0)
Monocytes Absolute: 0.5 10*3/uL (ref 0.1–1.0)
Monocytes Relative: 11 %
Neutro Abs: 2.6 10*3/uL (ref 1.7–7.7)
Neutrophils Relative %: 58 %

## 2021-06-03 LAB — COMPREHENSIVE METABOLIC PANEL
ALT: 13 U/L (ref 0–44)
AST: 21 U/L (ref 15–41)
Albumin: 4.1 g/dL (ref 3.5–5.0)
Alkaline Phosphatase: 82 U/L (ref 38–126)
Anion gap: 14 (ref 5–15)
BUN: 15 mg/dL (ref 8–23)
CO2: 23 mmol/L (ref 22–32)
Calcium: 9.7 mg/dL (ref 8.9–10.3)
Chloride: 101 mmol/L (ref 98–111)
Creatinine, Ser: 0.91 mg/dL (ref 0.44–1.00)
GFR, Estimated: >60 mL/min (ref >60)
Glucose, Bld: 133 mg/dL — ABNORMAL HIGH (ref 70–99)
Potassium: 3.4 mmol/L — ABNORMAL LOW (ref 3.5–5.1)
Sodium: 138 mmol/L (ref 135–145)
Total Bilirubin: 1 mg/dL (ref 0.3–1.2)
Total Protein: 8 g/dL (ref 6.5–8.1)

## 2021-06-03 LAB — CBC
HCT: 43.6 % (ref 36.0–46.0)
Hemoglobin: 14.1 g/dL (ref 12.0–15.0)
MCH: 29.4 pg (ref 26.0–34.0)
MCHC: 32.3 g/dL (ref 30.0–36.0)
MCV: 91 fL (ref 80.0–100.0)
Platelets: 216 10*3/uL (ref 150–400)
RBC: 4.79 MIL/uL (ref 3.87–5.11)
RDW: 12.8 % (ref 11.5–15.5)
WBC: 4.6 10*3/uL (ref 4.0–10.5)
nRBC: 0 % (ref 0.0–0.2)

## 2021-06-03 LAB — PROTIME-INR
INR: 1 (ref 0.8–1.2)
Prothrombin Time: 12.7 seconds (ref 11.4–15.2)

## 2021-06-03 LAB — APTT: aPTT: 26 seconds (ref 24–36)

## 2021-06-03 LAB — TROPONIN I (HIGH SENSITIVITY): Troponin I (High Sensitivity): 16 ng/L (ref <18)

## 2021-06-03 MED ORDER — SODIUM CHLORIDE 0.9% FLUSH
3.0000 mL | Freq: Once | INTRAVENOUS | Status: AC
  Administered 2021-06-03: 3 mL via INTRAVENOUS

## 2021-06-03 NOTE — ED Triage Notes (Addendum)
Pt comes with c/o dizziness for about 3 weeks. Pt states she went to ENT and had test performed that was neg for vertigo. Pt states that yesterday she started to have flashes in her left eye and then later her right. ? ?Pt denies any blurry vision. Pt denies any weakness. Pt states headache that comes and goes. Pt states she feels foggy at times. ?

## 2021-06-03 NOTE — ED Notes (Signed)
Pt. Straight to CT from triage. Will assess when brought to ED room. ?

## 2021-06-03 NOTE — ED Notes (Signed)
CBG completed ith lab work ?

## 2021-06-03 NOTE — ED Notes (Signed)
Pt's EKG given to Dr. Roxan Hockey, verified no STEMI. ?

## 2021-06-03 NOTE — ED Provider Notes (Signed)
? ?[facility] ?Provider Note ? ? ? Event Date/Time  ? First MD Initiated Contact with Patient 06/03/21 [id number]   ?  (approximate) ? ? ?History  ? ?Dizziness ? ? ?HPI ? ?Sheila Herrera is a 71 y.o. female cardiovascular disease status post stent not on any anticoagulation does not smoke presents to the ER for evaluation of 3 weeks of dizziness.  Particular worse in the morning.  She feels like the room is spinning around.  No unsteadiness no numbness or tingling today has had some intermittent tingling in both of her hands.  Denies any neck pain.  States that she has been having occasional visual disturbance and yesterday had a brown spot in the right side of her vision that lasted only a second or 2 and then this morning she woke up she noticed a white streak left side of her vision lasting only a second.  Denies any blurry vision at this time. ?  ? ? ?Physical Exam  ? ?Triage Vital Signs: ?ED Triage Vitals  ?Enc Vitals Group  ?   BP 06/03/21 0857 (!) 168/82  ?   Pulse Rate 06/03/21 0857 88  ?   Resp 06/03/21 0857 18  ?   Temp 06/03/21 0857 98 ?F (36.7 ?C)  ?   Temp src --   ?   SpO2 06/03/21 0857 97 %  ?   Weight --   ?   Height --   ?   Head Circumference --   ?   Peak Flow --   ?   Pain Score 06/03/21 0856 4  ?   Pain Loc --   ?   Pain Edu? --   ?   Excl. in GC? --   ? ? ?Most recent vital signs: ?Vitals:  ? 06/03/21 0857  ?BP: (!) 168/82  ?Pulse: 88  ?Resp: 18  ?Temp: 98 ?F (36.7 ?C)  ?SpO2: 97%  ? ? ? ?Constitutional: Alert  ?Eyes: Conjunctivae are normal.  ?Head: Atraumatic. ?Nose: No congestion/rhinnorhea. ?Mouth/Throat: Mucous membranes are moist.   ?Neck: Painless ROM.  ?Cardiovascular:   Good peripheral circulation. ?Respiratory: Normal respiratory effort.  No retractions.  ?Gastrointestinal: Soft and nontender.  ?Musculoskeletal:  no deformity ?Neurologic:  CN- intact.  No facial droop, Normal FNF.  Normal heel to shin.  Sensation intact bilaterally. Normal speech and language. No  gross focal neurologic deficits are appreciated. No gait instability. ?Skin:  Skin is warm, dry and intact. No rash noted. ?Psychiatric: Mood and affect are normal. Speech and behavior are normal. ? ? ? ?ED Results / Procedures / Treatments  ? ?Labs ?(all labs ordered are listed, but only abnormal results are displayed) ?Labs Reviewed  ?COMPREHENSIVE METABOLIC PANEL - Abnormal; Notable for the following components:  ?    Result Value  ? Potassium 3.4 (*)   ? Glucose, Bld 133 (*)   ? All other components within normal limits  ?PROTIME-INR  ?APTT  ?CBC  ?DIFFERENTIAL  ?CBG MONITORING, ED  ?TROPONIN I (HIGH SENSITIVITY)  ?TROPONIN I (HIGH SENSITIVITY)  ? ? ? ?EKG ? ?ED ECG REPORT ?I, Willy Eddy, the attending physician, personally viewed and interpreted this ECG. ? ? Date: 06/03/2021 ? EKG Time: 9:03 ? Rate: 90 ? Rhythm: sinus ? Axis: normal ? Intervals: normal ? ST&T Change: nonspecific st abn, no stemi ? ? ? ?RADIOLOGY ?Please see ED Course for my review and interpretation. ? ?I personally reviewed all radiographic images ordered to evaluate for the above  acute complaints and reviewed radiology reports and findings.  These findings were personally discussed with the patient.  Please see medical record for radiology report. ? ? ? ?PROCEDURES: ? ?Critical Care performed: No ? ?Procedures ? ? ?MEDICATIONS ORDERED IN ED: ?Medications  ?sodium chloride flush (NS) 0.9 % injection 3 mL (3 mLs Intravenous Given 06/03/21 0928)  ? ? ? ?IMPRESSION / MDM / ASSESSMENT AND PLAN / ED COURSE  ?I reviewed the triage vital signs and the nursing notes. ?             ?               ? ?Differential diagnosis includes, but is not limited to, vertigo, dehydration, electrolyte abnormality, medication effect, CVA, TIA, mass, retinal detachment ? ?Patient presenting to the ER with symptoms as described above.  Patient clinically nontoxic-appearing hemodynamically stable.  Exam is reassuring.  Not consistent with detachment based on exam  brief episode she does not have any visual field cuts or abnormality or vision at this time.  I am going to order MRI of her brain to further evaluate for possible subacute CVA.  CT head on my review and interpretation does not show any evidence of mass or bleed. ? ? ?Clinical Course as of 06/03/21 1154  ?Wed Jun 03, 2021  ?1153 Reassessed.  No acute findings on MRI.  Not consistent with acute CVA.  Discussed case with neurology given MRI findings but given duration of symptoms and her presentation without objective deficit at this point this does appear stable and appropriate for outpatient follow-up.  Patient agreeable to plan. [PR]  ?  ?Clinical Course User Index ?[PR] Willy Eddy, MD  ? ? ? ?FINAL CLINICAL IMPRESSION(S) / ED DIAGNOSES  ? ?Final diagnoses:  ?Dizziness  ? ? ? ?Rx / DC Orders  ? ?ED Discharge Orders   ? ? None  ? ?  ? ? ? ?Note:  This document was prepared using Dragon voice recognition software and may include unintentional dictation errors. ? ?  ?Willy Eddy, MD ?06/03/21 1154 ? ?

## 2021-06-03 NOTE — ED Notes (Signed)
Patient transported to MRI 

## 2021-06-03 NOTE — ED Notes (Signed)
ED Provider at bedside. 

## 2021-06-09 ENCOUNTER — Encounter: Payer: Medicare HMO | Admitting: Physician Assistant

## 2021-06-11 DIAGNOSIS — H539 Unspecified visual disturbance: Secondary | ICD-10-CM | POA: Diagnosis not present

## 2021-06-11 DIAGNOSIS — R7989 Other specified abnormal findings of blood chemistry: Secondary | ICD-10-CM | POA: Diagnosis not present

## 2021-06-11 DIAGNOSIS — R42 Dizziness and giddiness: Secondary | ICD-10-CM | POA: Diagnosis not present

## 2021-06-11 DIAGNOSIS — E538 Deficiency of other specified B group vitamins: Secondary | ICD-10-CM | POA: Diagnosis not present

## 2021-06-11 DIAGNOSIS — Z8673 Personal history of transient ischemic attack (TIA), and cerebral infarction without residual deficits: Secondary | ICD-10-CM | POA: Diagnosis not present

## 2021-06-15 DIAGNOSIS — H903 Sensorineural hearing loss, bilateral: Secondary | ICD-10-CM | POA: Diagnosis not present

## 2021-06-15 DIAGNOSIS — R42 Dizziness and giddiness: Secondary | ICD-10-CM | POA: Diagnosis not present

## 2021-06-16 ENCOUNTER — Ambulatory Visit (INDEPENDENT_AMBULATORY_CARE_PROVIDER_SITE_OTHER): Payer: Medicare HMO | Admitting: Family Medicine

## 2021-06-16 ENCOUNTER — Encounter: Payer: Self-pay | Admitting: Family Medicine

## 2021-06-16 VITALS — BP 127/68 | HR 69 | Resp 16 | Ht 63.0 in | Wt 174.0 lb

## 2021-06-16 DIAGNOSIS — I25110 Atherosclerotic heart disease of native coronary artery with unstable angina pectoris: Secondary | ICD-10-CM | POA: Diagnosis not present

## 2021-06-16 DIAGNOSIS — E1165 Type 2 diabetes mellitus with hyperglycemia: Secondary | ICD-10-CM

## 2021-06-16 DIAGNOSIS — E039 Hypothyroidism, unspecified: Secondary | ICD-10-CM

## 2021-06-16 DIAGNOSIS — E538 Deficiency of other specified B group vitamins: Secondary | ICD-10-CM

## 2021-06-16 DIAGNOSIS — R42 Dizziness and giddiness: Secondary | ICD-10-CM | POA: Diagnosis not present

## 2021-06-16 MED ORDER — LEVOTHYROXINE SODIUM 50 MCG PO TABS: 50.0000 ug | ORAL_TABLET | Freq: Every day | ORAL | 0 refills | Status: DC

## 2021-06-16 MED ORDER — CYANOCOBALAMIN 1000 MCG/ML IJ SOLN: INTRAMUSCULAR | 4 refills | Status: DC

## 2021-06-16 NOTE — Progress Notes (Signed)
?  ? ? ?Established patient visit ? ?I,April Miller,acting as a scribe for Megan Mans, MD.,have documented all relevant documentation on the behalf of Megan Mans, MD,as directed by  Megan Mans, MD while in the presence of Megan Mans, MD. ? ? ?Patient: Sheila Herrera   DOB: 07/26/1950   71 y.o. Female  MRN: [id number] ?Visit Date: 06/16/2021 ? ?Today's healthcare provider: Megan Mans, MD  ? ?Chief Complaint  ?Patient presents with  ? Follow-up  ? ?Subjective  ?  ?HPI  ?She comes in today for follow-up of recent dizziness.  This is very nondescript.  Has a history of CAD but she has no syncope or presyncope or any cardiac symptoms. ?Overall she feels fairly well. ?Hypertension, follow-up ? ?BP Readings from Last 3 Encounters:  ?06/16/21 127/68  ?06/03/21 (!) 168/82  ?04/22/21 118/70  ? Wt Readings from Last 3 Encounters:  ?06/16/21 174 lb (78.9 kg)  ?05/20/21 170 lb (77.1 kg)  ?04/22/21 170 lb (77.1 kg)  ?  ? ?She was last seen for hypertension 7 months ago.  ?Management since that visit includes; taking amlodipine 5 mg. HCTZ, metoprolol 25 mg. ?Outside blood pressures are 116/68. ? ?--------------------------------------------------------------------------------------------------- ? ? ?Medications: ?Outpatient Medications Prior to Visit  ?Medication Sig  ? Alirocumab (PRALUENT) 150 MG/ML SOAJ Inject into the skin.  ? amLODipine (NORVASC) 5 MG tablet Take 1 tablet by mouth daily.  ? aspirin 81 MG chewable tablet Chew 81 mg by mouth.  ? ergocalciferol (VITAMIN D2) 1.25 MG (50000 UT) capsule Take 1 capsule once a week for 8 weeks.  ? estradiol (ESTRACE) 0.1 MG/GM vaginal cream Estrogen Cream Instruction Discard applicator Apply pea sized amount to tip of finger to urethra before bed. Wash hands well after application. Use Monday, Wednesday and Friday  ? hydrochlorothiazide (HYDRODIURIL) 25 MG tablet hydrochlorothiazide 25 mg tablet ? TAKE 1 TABLET BY MOUTH EVERY DAY  ?  metoprolol succinate (TOPROL-XL) 25 MG 24 hr tablet Take 12.5 mg by mouth.  ? nitroGLYCERIN (NITROSTAT) 0.4 MG SL tablet   ? [DISCONTINUED] telmisartan (MICARDIS) 20 MG tablet Take 1 tablet by mouth daily. (Patient not taking: Reported on 06/16/2021)  ? ?No facility-administered medications prior to visit.  ? ? ?Review of Systems  ?Constitutional:  Negative for appetite change, chills, fatigue and fever.  ?Respiratory:  Negative for chest tightness and shortness of breath.   ?Cardiovascular:  Negative for chest pain and palpitations.  ?Gastrointestinal:  Negative for abdominal pain, nausea and vomiting.  ?Neurological:  Negative for dizziness and weakness.  ? ?Last thyroid functions ?Lab Results  ?Component Value Date  ? TSH 4.330 02/05/2016  ? T4TOTAL 7.4 02/05/2016  ? ?  ?  Objective  ?  ?BP 127/68 (BP Location: Left Arm, Patient Position: Sitting, Cuff Size: Normal)   Pulse 69   Resp 16   Ht 5\' 3"  (1.6 m)   Wt 174 lb (78.9 kg)   SpO2 100%   BMI 30.82 kg/m?  ?BP Readings from Last 3 Encounters:  ?06/16/21 127/68  ?06/03/21 (!) 168/82  ?04/22/21 118/70  ? ?Wt Readings from Last 3 Encounters:  ?06/16/21 174 lb (78.9 kg)  ?05/20/21 170 lb (77.1 kg)  ?04/22/21 170 lb (77.1 kg)  ? ?  ? ?Physical Exam ?Vitals reviewed.  ?Constitutional:   ?   General: She is not in acute distress. ?   Appearance: She is well-developed.  ?HENT:  ?   Head: Normocephalic and atraumatic.  ?   Right  Ear: Hearing normal.  ?   Left Ear: Hearing normal.  ?   Nose: Nose normal.  ?Eyes:  ?   General: Lids are normal. No scleral icterus.    ?   Right eye: No discharge.     ?   Left eye: No discharge.  ?   Conjunctiva/sclera: Conjunctivae normal.  ?Cardiovascular:  ?   Rate and Rhythm: Normal rate and regular rhythm.  ?   Heart sounds: Normal heart sounds.  ?Pulmonary:  ?   Effort: Pulmonary effort is normal. No respiratory distress.  ?Skin: ?   Findings: No lesion or rash.  ?Neurological:  ?   General: No focal deficit present.  ?   Mental  Status: She is alert and oriented to person, place, and time.  ?Psychiatric:     ?   Mood and Affect: Mood normal.     ?   Speech: Speech normal.     ?   Behavior: Behavior normal.     ?   Thought Content: Thought content normal.     ?   Judgment: Judgment normal.  ?  ? ? ?No results found for any visits on 06/16/21. ? Assessment & Plan  ?  ? ?1. B12 deficiency ?B12 subcu weekly for 1 month and then monthly.  Follow-up in 2 to 3 months ?- cyanocobalamin (,VITAMIN B-12,) 1000 MCG/ML injection; Inject 1 mL Once Weekly Until June 2023  Dispense: 1 mL; Refill: 4 ? ?2. Hypothyroidism, unspecified type ?Synthroid 50 mcg daily.  Follow-up TSH in 3 months ?- levothyroxine (SYNTHROID) 50 MCG tablet; Take 1 tablet (50 mcg total) by mouth daily before breakfast.  Dispense: 90 tablet; Refill: 0 ?- TSH ? ?3. Type 2 diabetes mellitus with hyperglycemia, without long-term current use of insulin (HCC) ?Follow-up A1c is appropriate ?- Hemoglobin A1c ? ?4. Coronary artery disease involving native coronary artery of native heart with unstable angina pectoris (HCC) ?Factors treated. ? ?5. Dizziness ?No discernible cause of her symptoms. ? ? ?Return in about 3 months (around 09/16/2021).  ?   ? ?I, Megan Mans, MD, have reviewed all documentation for this visit. The documentation on 06/21/21 for the exam, diagnosis, procedures, and orders are all accurate and complete. ? ? ? ?Keasia Dubose Wendelyn Breslow, MD  ?[facility] ?[phone] (phone) ?[phone] (fax) ? ?[facility] ?

## 2021-06-17 ENCOUNTER — Telehealth: Payer: Self-pay | Admitting: Family Medicine

## 2021-06-17 DIAGNOSIS — E538 Deficiency of other specified B group vitamins: Secondary | ICD-10-CM

## 2021-06-17 NOTE — Telephone Encounter (Signed)
[facility] called and spoke to [place], [facility] about the refill(s) Levothyroxine and Vitamin B-12 injection requested. Advised it was sent on 06/16/21, he says she picked them up today. ?

## 2021-06-17 NOTE — Telephone Encounter (Signed)
Medication Refill - Medication:  ?levothyroxine (SYNTHROID) 50 MCG tablet  ?cyanocobalamin (,VITAMIN B-12,) 1000 MCG/ML injection ? ?*Pt called in stating when she contacted the pharmacy, they said they had not received it, and is needing it resent* ? ?Has the patient contacted their pharmacy? Yes.   ?Contact PCP ? ?Preferred Pharmacy (with phone number or street name):  ?[id number] [place], [place] - [address]  ?[address], [place] [address]  ?Phone: [phone] Fax: [phone]  ? ?Has the patient been seen for an appointment in the last year OR does the patient have an upcoming appointment? Yes.   ? ?Agent: Please be advised that RX refills may take up to 3 business days. We ask that you follow-up with your pharmacy. ?

## 2021-07-01 DIAGNOSIS — I10 Essential (primary) hypertension: Secondary | ICD-10-CM | POA: Diagnosis not present

## 2021-07-01 DIAGNOSIS — I6523 Occlusion and stenosis of bilateral carotid arteries: Secondary | ICD-10-CM | POA: Diagnosis not present

## 2021-07-01 DIAGNOSIS — E785 Hyperlipidemia, unspecified: Secondary | ICD-10-CM | POA: Diagnosis not present

## 2021-07-01 DIAGNOSIS — D518 Other vitamin B12 deficiency anemias: Secondary | ICD-10-CM | POA: Diagnosis not present

## 2021-07-01 DIAGNOSIS — R42 Dizziness and giddiness: Secondary | ICD-10-CM | POA: Diagnosis not present

## 2021-07-07 ENCOUNTER — Other Ambulatory Visit: Payer: Self-pay | Admitting: Family Medicine

## 2021-07-07 DIAGNOSIS — E538 Deficiency of other specified B group vitamins: Secondary | ICD-10-CM

## 2021-07-08 DIAGNOSIS — D518 Other vitamin B12 deficiency anemias: Secondary | ICD-10-CM | POA: Diagnosis not present

## 2021-07-15 DIAGNOSIS — D518 Other vitamin B12 deficiency anemias: Secondary | ICD-10-CM | POA: Diagnosis not present

## 2021-07-21 DIAGNOSIS — R42 Dizziness and giddiness: Secondary | ICD-10-CM | POA: Diagnosis not present

## 2021-08-04 ENCOUNTER — Ambulatory Visit (INDEPENDENT_AMBULATORY_CARE_PROVIDER_SITE_OTHER): Payer: Medicare HMO | Admitting: Family Medicine

## 2021-08-04 ENCOUNTER — Encounter: Payer: Self-pay | Admitting: Family Medicine

## 2021-08-04 VITALS — BP 129/74 | HR 69 | Temp 98.3°F | Resp 16 | Wt 171.0 lb

## 2021-08-04 DIAGNOSIS — H9202 Otalgia, left ear: Secondary | ICD-10-CM

## 2021-08-04 DIAGNOSIS — E1165 Type 2 diabetes mellitus with hyperglycemia: Secondary | ICD-10-CM | POA: Diagnosis not present

## 2021-08-04 DIAGNOSIS — S0300XA Dislocation of jaw, unspecified side, initial encounter: Secondary | ICD-10-CM | POA: Diagnosis not present

## 2021-08-04 DIAGNOSIS — E038 Other specified hypothyroidism: Secondary | ICD-10-CM | POA: Diagnosis not present

## 2021-08-04 DIAGNOSIS — I25110 Atherosclerotic heart disease of native coronary artery with unstable angina pectoris: Secondary | ICD-10-CM | POA: Diagnosis not present

## 2021-08-04 DIAGNOSIS — E7800 Pure hypercholesterolemia, unspecified: Secondary | ICD-10-CM | POA: Diagnosis not present

## 2021-08-04 DIAGNOSIS — I10 Essential (primary) hypertension: Secondary | ICD-10-CM | POA: Diagnosis not present

## 2021-08-04 MED ORDER — MELOXICAM 7.5 MG PO TABS: 7.5000 mg | ORAL_TABLET | Freq: Every day | ORAL | 0 refills | Status: DC

## 2021-08-04 NOTE — Progress Notes (Signed)
Established patient visit  I,April Miller,acting as a scribe for Wilhemena Durie, MD.,have documented all relevant documentation on the behalf of Wilhemena Durie, MD,as directed by  Wilhemena Durie, MD while in the presence of Wilhemena Durie, MD.   Patient: Sheila Herrera   DOB: 10-May-1950   71 y.o. Female  MRN: [id number] Visit Date: 08/04/2021  Today's healthcare provider: Wilhemena Durie, MD   Chief Complaint  Patient presents with   Jaw Pain   Subjective    HPI  Patient is being seen today for swollen glands and its sore to open her mouth. Patient states she woke up yesterday with the left side of jaw hurting. Patient states her left ear began hurting last night. Patient states she does not have any URI symptoms.   Medications: Outpatient Medications Prior to Visit  Medication Sig   Alirocumab (PRALUENT) 150 MG/ML SOAJ Inject into the skin.   amLODipine (NORVASC) 5 MG tablet Take 1 tablet by mouth daily.   aspirin 81 MG chewable tablet Chew 81 mg by mouth.   cyanocobalamin (,VITAMIN B-12,) 1000 MCG/ML injection Inject 1 mL Once Weekly Until June 2023   ergocalciferol (VITAMIN D2) 1.25 MG (50000 UT) capsule Take 1 capsule once a week for 8 weeks.   estradiol (ESTRACE) 0.1 MG/GM vaginal cream Estrogen Cream Instruction Discard applicator Apply pea sized amount to tip of finger to urethra before bed. Wash hands well after application. Use Monday, Wednesday and Friday   hydrochlorothiazide (HYDRODIURIL) 25 MG tablet hydrochlorothiazide 25 mg tablet  TAKE 1 TABLET BY MOUTH EVERY DAY   levothyroxine (SYNTHROID) 50 MCG tablet Take 1 tablet (50 mcg total) by mouth daily before breakfast. Brand Name Only   metoprolol succinate (TOPROL-XL) 25 MG 24 hr tablet Take 12.5 mg by mouth.   nitroGLYCERIN (NITROSTAT) 0.4 MG SL tablet    No facility-administered medications prior to visit.    Review of Systems  Last lipids No results found for: "CHOL", "HDL",  "LDLCALC", "LDLDIRECT", "TRIG", "CHOLHDL"     Objective    BP 129/74 (BP Location: Right Arm, Patient Position: Sitting, Cuff Size: Normal)   Pulse 69   Temp 98.3 F (36.8 C) (Oral)   Resp 16   Wt 171 lb (77.6 kg)   SpO2 99%   BMI 30.29 kg/m  BP Readings from Last 3 Encounters:  08/04/21 129/74  06/16/21 127/68  06/03/21 (!) 168/82   Wt Readings from Last 3 Encounters:  08/04/21 171 lb (77.6 kg)  06/16/21 174 lb (78.9 kg)  05/20/21 170 lb (77.1 kg)      Physical Exam Vitals reviewed.  Constitutional:      General: She is not in acute distress.    Appearance: She is well-developed.  HENT:     Head: Normocephalic and atraumatic.     Right Ear: Hearing normal.     Left Ear: Hearing normal.     Nose: Nose normal.     Mouth/Throat:     Pharynx: Oropharynx is clear.     Comments: It is uncomfortable for the patient to open her mouth wide. Eyes:     General: Lids are normal. No scleral icterus.       Right eye: No discharge.        Left eye: No discharge.     Conjunctiva/sclera: Conjunctivae normal.  Cardiovascular:     Rate and Rhythm: Normal rate and regular rhythm.     Heart sounds: Normal heart sounds.  Pulmonary:     Effort: Pulmonary effort is normal. No respiratory distress.  Musculoskeletal:     Cervical back: Neck supple.  Lymphadenopathy:     Cervical: No cervical adenopathy.  Skin:    Findings: No lesion or rash.  Neurological:     General: No focal deficit present.     Mental Status: She is alert and oriented to person, place, and time.  Psychiatric:        Mood and Affect: Mood normal.        Speech: Speech normal.        Behavior: Behavior normal.        Thought Content: Thought content normal.        Judgment: Judgment normal.       No results found for any visits on 08/04/21.  Assessment & Plan     1. Dislocation of temporomandibular joint, initial encounter/TMJ I think TMJ is the etiology of all of this.  Treat with meloxicam and  consider a muscle relaxer or prednisone going forward. - CBC w/Diff/Platelet - Sed Rate (ESR) - meloxicam (MOBIC) 7.5 MG tablet; Take 1 tablet (7.5 mg total) by mouth daily.  Dispense: 60 tablet; Refill: 0  2. Ear pain, left  - CBC w/Diff/Platelet - Sed Rate (ESR) - meloxicam (MOBIC) 7.5 MG tablet; Take 1 tablet (7.5 mg total) by mouth daily.  Dispense: 60 tablet; Refill: 0   Return in about 2 months (around 10/04/2021).      I, Wilhemena Durie, MD, have reviewed all documentation for this visit. The documentation on 08/05/21 for the exam, diagnosis, procedures, and orders are all accurate and complete.    Catera Hankins Cranford Mon, MD  [facility] [phone] (phone) [phone] (fax)  [place]

## 2021-08-05 LAB — CBC WITH DIFFERENTIAL/PLATELET
Basophils Absolute: 0 10*3/uL (ref 0.0–0.2)
Basos: 0 %
EOS (ABSOLUTE): 0.2 10*3/uL (ref 0.0–0.4)
Eos: 4 %
Hematocrit: 44.2 % (ref 34.0–46.6)
Hemoglobin: 14.8 g/dL (ref 11.1–15.9)
Immature Grans (Abs): 0 10*3/uL (ref 0.0–0.1)
Immature Granulocytes: 0 %
Lymphocytes Absolute: 1.7 10*3/uL (ref 0.7–3.1)
Lymphs: 32 %
MCH: 30.6 pg (ref 26.6–33.0)
MCHC: 33.5 g/dL (ref 31.5–35.7)
MCV: 92 fL (ref 79–97)
Monocytes Absolute: 0.6 10*3/uL (ref 0.1–0.9)
Monocytes: 12 %
Neutrophils Absolute: 2.7 10*3/uL (ref 1.4–7.0)
Neutrophils: 52 %
Platelets: 209 10*3/uL (ref 150–450)
RBC: 4.83 x10E6/uL (ref 3.77–5.28)
RDW: 14.5 % (ref 11.7–15.4)
WBC: 5.2 10*3/uL (ref 3.4–10.8)

## 2021-08-05 LAB — SEDIMENTATION RATE: Sed Rate: 18 mm/hr (ref 0–40)

## 2021-08-05 LAB — HEMOGLOBIN A1C
Est. average glucose Bld gHb Est-mCnc: 137 mg/dL
Hgb A1c MFr Bld: 6.4 % — ABNORMAL HIGH (ref 4.8–5.6)

## 2021-08-05 LAB — TSH: TSH: 8.65 u[IU]/mL — ABNORMAL HIGH (ref 0.450–4.500)

## 2021-08-10 ENCOUNTER — Telehealth: Payer: Self-pay

## 2021-08-10 MED ORDER — LEVOTHYROXINE SODIUM 75 MCG PO TABS: 75.0000 ug | ORAL_TABLET | Freq: Every day | ORAL | 0 refills | Status: DC

## 2021-08-10 NOTE — Telephone Encounter (Signed)
Patient advised.

## 2021-08-10 NOTE — Telephone Encounter (Signed)
-----   Message from Sheila Herrera., MD sent at 08/08/2021  3:58 PM EDT ----- Labs stable.  Thyroid slightly low.  Increase Levoxyl from 50 to 75 mcg daily.  Please advise patient.

## 2021-08-12 ENCOUNTER — Ambulatory Visit: Payer: Medicare HMO

## 2021-08-19 ENCOUNTER — Ambulatory Visit (INDEPENDENT_AMBULATORY_CARE_PROVIDER_SITE_OTHER): Payer: Medicare HMO

## 2021-08-19 DIAGNOSIS — E538 Deficiency of other specified B group vitamins: Secondary | ICD-10-CM

## 2021-08-19 MED ORDER — CYANOCOBALAMIN 1000 MCG/ML IJ SOLN
1000.0000 ug | Freq: Once | INTRAMUSCULAR | Status: AC
  Administered 2021-08-19: 1000 ug via INTRAMUSCULAR

## 2021-08-26 ENCOUNTER — Ambulatory Visit (INDEPENDENT_AMBULATORY_CARE_PROVIDER_SITE_OTHER): Payer: Medicare HMO | Admitting: Family Medicine

## 2021-08-26 ENCOUNTER — Encounter: Payer: Self-pay | Admitting: Family Medicine

## 2021-08-26 VITALS — BP 128/80 | HR 78 | Temp 98.4°F | Resp 16 | Wt 171.1 lb

## 2021-08-26 DIAGNOSIS — I10 Essential (primary) hypertension: Secondary | ICD-10-CM

## 2021-08-26 DIAGNOSIS — E1165 Type 2 diabetes mellitus with hyperglycemia: Secondary | ICD-10-CM

## 2021-08-26 DIAGNOSIS — E038 Other specified hypothyroidism: Secondary | ICD-10-CM

## 2021-08-26 DIAGNOSIS — I25110 Atherosclerotic heart disease of native coronary artery with unstable angina pectoris: Secondary | ICD-10-CM

## 2021-08-26 NOTE — Progress Notes (Signed)
I,Jana Robinson,acting as a scribe for Megan Mans, MD.,have documented all relevant documentation on the behalf of Megan Mans, MD,as directed by  Megan Mans, MD while in the presence of Megan Mans, MD.   Established patient visit   Patient: Sheila Herrera   DOB: 15-Oct-1950   71 y.o. Female  MRN: [id number] Visit Date: 08/26/2021  Today's healthcare provider: Megan Mans, MD   Chief Complaint  Patient presents with   Hypothyroidism   Subjective    HPI  Patient is here for follow up on chronic problems.  She feels well and has no complaints. Follow up for thyroid   The patient was last seen on 08/04/21 Changes made at last visit include increase Levoxyl from 50 to 75 mcg daily.  She reports excellent compliance with treatment. She feels that condition is Unchanged. She is not having side effects.   -----------------------------------------------------------------------------------------  Medications: Outpatient Medications Prior to Visit  Medication Sig   Alirocumab (PRALUENT) 150 MG/ML SOAJ Inject into the skin.   amLODipine (NORVASC) 5 MG tablet Take 1 tablet by mouth daily.   aspirin 81 MG chewable tablet Chew 81 mg by mouth.   cyanocobalamin (,VITAMIN B-12,) 1000 MCG/ML injection Inject 1 mL Once Weekly Until June 2023   ergocalciferol (VITAMIN D2) 1.25 MG (50000 UT) capsule Take 1 capsule once a week for 8 weeks.   estradiol (ESTRACE) 0.1 MG/GM vaginal cream Estrogen Cream Instruction Discard applicator Apply pea sized amount to tip of finger to urethra before bed. Wash hands well after application. Use Monday, Wednesday and Friday   hydrochlorothiazide (HYDRODIURIL) 25 MG tablet hydrochlorothiazide 25 mg tablet  TAKE 1 TABLET BY MOUTH EVERY DAY   levothyroxine (SYNTHROID) 75 MCG tablet Take 1 tablet (75 mcg total) by mouth daily before breakfast. Brand Name Only   meloxicam (MOBIC) 7.5 MG tablet Take 1 tablet (7.5 mg total)  by mouth daily.   metoprolol succinate (TOPROL-XL) 25 MG 24 hr tablet Take 12.5 mg by mouth.   nitroGLYCERIN (NITROSTAT) 0.4 MG SL tablet    No facility-administered medications prior to visit.    Review of Systems  Constitutional:  Negative for appetite change, chills, fatigue and fever.  Respiratory:  Negative for chest tightness and shortness of breath.   Cardiovascular:  Negative for chest pain and palpitations.  Gastrointestinal:  Negative for abdominal pain, nausea and vomiting.  Neurological:  Negative for dizziness and weakness.    Last thyroid functions Lab Results  Component Value Date   TSH 8.650 (H) 08/04/2021   T4TOTAL 7.4 02/05/2016       Objective    BP 128/80 (BP Location: Right Arm, Patient Position: Sitting, Cuff Size: Normal)   Pulse 78   Temp 98.4 F (36.9 C) (Oral)   Resp 16   Wt 171 lb 1.6 oz (77.6 kg)   SpO2 96%   BMI 30.31 kg/m  BP Readings from Last 3 Encounters:  08/26/21 128/80  08/04/21 129/74  06/16/21 127/68   Wt Readings from Last 3 Encounters:  08/26/21 171 lb 1.6 oz (77.6 kg)  08/04/21 171 lb (77.6 kg)  06/16/21 174 lb (78.9 kg)      Physical Exam Vitals reviewed.  Constitutional:      General: She is not in acute distress.    Appearance: She is well-developed.  HENT:     Head: Normocephalic and atraumatic.     Right Ear: Hearing normal.     Left Ear: Hearing normal.  Nose: Nose normal.  Eyes:     General: Lids are normal. No scleral icterus.       Right eye: No discharge.        Left eye: No discharge.     Conjunctiva/sclera: Conjunctivae normal.  Cardiovascular:     Rate and Rhythm: Normal rate and regular rhythm.     Heart sounds: Normal heart sounds.  Pulmonary:     Effort: Pulmonary effort is normal. No respiratory distress.  Skin:    Findings: No lesion or rash.  Neurological:     General: No focal deficit present.     Mental Status: She is alert and oriented to person, place, and time.  Psychiatric:         Mood and Affect: Mood normal.        Speech: Speech normal.        Behavior: Behavior normal.        Thought Content: Thought content normal.        Judgment: Judgment normal.       No results found for any visits on 08/26/21.  Assessment & Plan     1. Hypertension, unspecified type Good control. - Renal Function Panel  2. Type 2 diabetes mellitus with hyperglycemia, without long-term current use of insulin (HCC) We will A1c less than 7. - HgB A1c  3. Coronary artery disease involving native coronary artery of native heart with unstable angina pectoris (HCC) All risk factors treated.  4. Other specified hypothyroidism For euthyroid TSH.   No follow-ups on file.      I, Megan Mans, MD, have reviewed all documentation for this visit. The documentation on 08/29/21 for the exam, diagnosis, procedures, and orders are all accurate and complete.    Sylvia Helms Wendelyn Breslow, MD  [facility] [phone] (phone) [phone] (fax)  [facility]

## 2021-08-26 NOTE — Patient Instructions (Signed)
Come for labs for renal test and A1c 1 week before next appt.Marland Kitchen

## 2021-09-07 ENCOUNTER — Other Ambulatory Visit: Payer: Self-pay | Admitting: Family Medicine

## 2021-09-07 DIAGNOSIS — E039 Hypothyroidism, unspecified: Secondary | ICD-10-CM

## 2021-09-08 NOTE — Telephone Encounter (Signed)
Dose inconsistent with current med list. Requested Prescriptions  Pending Prescriptions Disp Refills  . levothyroxine (SYNTHROID) 50 MCG tablet [Pharmacy Med Name: LEVOTHYROXINE 50 MCG TABLET] 90 tablet 0    Sig: TAKE 1 TABLET BY MOUTH DAILY BEFORE BREAKFAST     Endocrinology:  Hypothyroid Agents Failed - 09/07/2021  8:32 AM      Failed - TSH in normal range and within 360 days    TSH  Date Value Ref Range Status  08/04/2021 8.650 (H) 0.450 - 4.500 uIU/mL Final         Passed - Valid encounter within last 12 months    Recent Outpatient Visits          1 week ago Hypertension, unspecified type   [facility] Maple Hudson., MD   1 month ago Dislocation of temporomandibular joint, initial encounter   [facility] Maple Hudson., MD   2 months ago B12 deficiency   [facility] Maple Hudson., MD   6 months ago Cystitis   [facility] Merita Norton T, FNP   9 months ago Coronary artery disease involving native coronary artery of native heart with unstable angina pectoris [facility])   [facility] Maple Hudson., MD      Future Appointments            In 2 months Maple Hudson., MD [facility]

## 2021-09-16 ENCOUNTER — Ambulatory Visit (INDEPENDENT_AMBULATORY_CARE_PROVIDER_SITE_OTHER): Payer: Medicare HMO

## 2021-09-16 DIAGNOSIS — E538 Deficiency of other specified B group vitamins: Secondary | ICD-10-CM

## 2021-09-16 MED ORDER — CYANOCOBALAMIN 1000 MCG/ML IJ SOLN
1000.0000 ug | Freq: Once | INTRAMUSCULAR | Status: AC
  Administered 2021-09-16: 1000 ug via INTRAMUSCULAR

## 2021-09-30 ENCOUNTER — Other Ambulatory Visit: Payer: Self-pay | Admitting: Family Medicine

## 2021-09-30 DIAGNOSIS — S0300XA Dislocation of jaw, unspecified side, initial encounter: Secondary | ICD-10-CM

## 2021-09-30 DIAGNOSIS — H9202 Otalgia, left ear: Secondary | ICD-10-CM

## 2021-09-30 NOTE — Telephone Encounter (Signed)
Requested Prescriptions  Pending Prescriptions Disp Refills  . meloxicam (MOBIC) 7.5 MG tablet [Pharmacy Med Name: MELOXICAM 7.5 MG TABLET] 60 tablet 0    Sig: TAKE 1 TABLET BY MOUTH EVERY DAY     Analgesics:  COX2 Inhibitors Failed - 09/30/2021  1:51 AM      Failed - Manual Review: Labs are only required if the patient has taken medication for more than 8 weeks.      Passed - HGB in normal range and within 360 days    Hemoglobin  Date Value Ref Range Status  08/04/2021 14.8 11.1 - 15.9 g/dL Final         Passed - Cr in normal range and within 360 days    Creatinine  Date Value Ref Range Status  06/07/2014 0.71 mg/dL Final    Comment:    0.44-1.00 NOTE: New Reference Range  04/16/14    Creatinine, Ser  Date Value Ref Range Status  06/03/2021 0.91 0.44 - 1.00 mg/dL Final         Passed - HCT in normal range and within 360 days    Hematocrit  Date Value Ref Range Status  08/04/2021 44.2 34.0 - 46.6 % Final         Passed - AST in normal range and within 360 days    AST  Date Value Ref Range Status  06/03/2021 21 15 - 41 U/L Final   SGOT(AST)  Date Value Ref Range Status  04/27/2011 26 15 - 37 Unit/L Final         Passed - ALT in normal range and within 360 days    ALT  Date Value Ref Range Status  06/03/2021 13 0 - 44 U/L Final   SGPT (ALT)  Date Value Ref Range Status  04/27/2011 29 U/L Final    Comment:    12-78 NOTE: NEW REFERENCE RANGE 01/01/2011          Passed - eGFR is 30 or above and within 360 days    EGFR (African American)  Date Value Ref Range Status  06/07/2014 >60  Final   GFR calc Af Amer  Date Value Ref Range Status  02/05/2016 88 >59 mL/min/1.73 Final   EGFR (Non-African Amer.)  Date Value Ref Range Status  06/07/2014 >60  Final    Comment:    eGFR values <56m/min/1.73 m2 may be an indication of chronic kidney disease (CKD). Calculated eGFR is useful in patients with stable renal function. The eGFR calculation will not be  reliable in acutely ill patients when serum creatinine is changing rapidly. It is not useful in patients on dialysis. The eGFR calculation may not be applicable to patients at the low and high extremes of body sizes, pregnant women, and vegetarians.    GFR, Estimated  Date Value Ref Range Status  06/03/2021 >60 >60 mL/min Final    Comment:    (NOTE) Calculated using the CKD-EPI Creatinine Equation (2021)          Passed - Patient is not pregnant      Passed - Valid encounter within last 12 months    Recent Outpatient Visits          1 month ago [facility]ension, unspecified [facility]   1 month ago Dislocation of temporomandibular joint, initial [facility]   3 months ago B12 deficiency   [facility]  Kaylyn Lim., [facility]   7 months ago [place] T[facility] Joe T, FNP   10 months ago Coronary artery disease involving native coronary artery of native heart with unstable angina pectoris [facility])   [facility] Jerrol Banana., MD      Future Appointments            In 1 month Jerrol Banana., MD [facility], [place]

## 2021-10-08 DIAGNOSIS — Z809 Family history of malignant neoplasm, unspecified: Secondary | ICD-10-CM | POA: Diagnosis not present

## 2021-10-08 DIAGNOSIS — E669 Obesity, unspecified: Secondary | ICD-10-CM | POA: Diagnosis not present

## 2021-10-08 DIAGNOSIS — E785 Hyperlipidemia, unspecified: Secondary | ICD-10-CM | POA: Diagnosis not present

## 2021-10-08 DIAGNOSIS — Z6829 Body mass index (BMI) 29.0-29.9, adult: Secondary | ICD-10-CM | POA: Diagnosis not present

## 2021-10-08 DIAGNOSIS — I2510 Atherosclerotic heart disease of native coronary artery without angina pectoris: Secondary | ICD-10-CM | POA: Diagnosis not present

## 2021-10-08 DIAGNOSIS — I252 Old myocardial infarction: Secondary | ICD-10-CM | POA: Diagnosis not present

## 2021-10-08 DIAGNOSIS — L299 Pruritus, unspecified: Secondary | ICD-10-CM | POA: Diagnosis not present

## 2021-10-08 DIAGNOSIS — E039 Hypothyroidism, unspecified: Secondary | ICD-10-CM | POA: Diagnosis not present

## 2021-10-08 DIAGNOSIS — Z7982 Long term (current) use of aspirin: Secondary | ICD-10-CM | POA: Diagnosis not present

## 2021-10-08 DIAGNOSIS — Z8249 Family history of ischemic heart disease and other diseases of the circulatory system: Secondary | ICD-10-CM | POA: Diagnosis not present

## 2021-10-08 DIAGNOSIS — I10 Essential (primary) hypertension: Secondary | ICD-10-CM | POA: Diagnosis not present

## 2021-10-14 ENCOUNTER — Ambulatory Visit (INDEPENDENT_AMBULATORY_CARE_PROVIDER_SITE_OTHER): Payer: Medicare HMO

## 2021-10-14 DIAGNOSIS — E538 Deficiency of other specified B group vitamins: Secondary | ICD-10-CM

## 2021-10-14 MED ORDER — CYANOCOBALAMIN 1000 MCG/ML IJ SOLN
1000.0000 ug | Freq: Once | INTRAMUSCULAR | Status: AC
  Administered 2021-10-14: 1000 ug via INTRAMUSCULAR

## 2021-10-21 ENCOUNTER — Ambulatory Visit: Payer: Self-pay

## 2021-10-21 NOTE — Patient Outreach (Signed)
  Care Coordination   Initial Visit Note   10/21/2021 Name: Sheila Herrera MRN: [id number] DOB: 07-14-1950  Sheila Herrera is a 71 y.o. year old female who sees Sheila Herrera., MD for primary care. I spoke with  Sheila Herrera by phone today.  What matters to the patients health and wellness today?  Discussing DM with the provider as this has not been an issue before.    Goals Addressed             This Visit's Progress    RNCM: Discuss with MD about DM       Care Coordination Interventions:  Lab Results  Component Value Date   HGBA1C 6.4 (H) 08/04/2021    Provided education to patient about basic DM disease process. Review and education provided. The patient will see the pcp in October and will discuss labwork and DM status. Encouraged the patient to write down questions to ask the pcp at upcoming visit  Counseled on importance of regular laboratory monitoring as prescribed. Patient states she will have new labwork at next visit. Review of goal of A1c and blood sugar ranges Discussed plans with patient for ongoing care management follow up and provided patient with direct contact information for care management team Provided patient with written educational materials related to hypo and hyperglycemia and importance of correct treatment. Review of sx and sx of hypo and hyperglycemia. Email sent with information on hypot and hyperglycemia also Reviewed scheduled/upcoming provider appointments including: 11-26-2021 at 220 pm with pcp Advised patient, providing education and rationale, to check cbg as directed  and record, calling pcp for findings outside established parameters. The patient currently does not have a meter and has not been taking her blood sugars. Education provided on asking the pcp if this is something the patient should start doing. Review of goal of a fasting blood sugar of <130 and post prandial of <180.  Review of patient status, including review of  consultants reports, relevant laboratory and other test results, and medications completed Screening for signs and symptoms of depression related to chronic disease state  Assessed social determinant of health barriers Review of the Care coordination program and the role of the RNCM. The patient agrees to work with the [facility] and agrees to [employer]. She has contact information for the [facility] and will call between outreaches if new needs or concerns arise.  Next AWV scheduled for 05-25-2022           SDOH assessments and interventions completed:  Yes  SDOH Interventions Today    Flowsheet Row Most Recent Value  SDOH Interventions   Utilities Interventions Intervention Not Indicated  Stress Interventions Intervention Not Indicated, Other (Comment)  [education about stress causing blood sugar elevations]        Care Coordination Interventions Activated:  Yes  Care Coordination Interventions:  Yes, provided   Follow up plan: Follow up call scheduled for 12-09-2021 at 130 pm    Encounter Outcome:  Pt. Visit Completed   Alto Denver RN, MSN, CCM Community Care Coordinator [facility]  [facility] Mobile: [phone]

## 2021-10-21 NOTE — Patient Instructions (Signed)
Visit Information  Thank you for taking time to visit with me today. Please don't hesitate to contact me if I can be of assistance to you.   Following are the goals we discussed today:   Goals Addressed             This Visit's Progress    RNCM: Discuss with MD about DM       Care Coordination Interventions:  Lab Results  Component Value Date   HGBA1C 6.4 (H) 08/04/2021    Provided education to patient about basic DM disease process. Review and education provided. The patient will see the pcp in October and will discuss labwork and DM status. Encouraged the patient to write down questions to ask the pcp at upcoming visit  Counseled on importance of regular laboratory monitoring as prescribed. Patient states she will have new labwork at next visit. Review of goal of A1c and blood sugar ranges Discussed plans with patient for ongoing care management follow up and provided patient with direct contact information for care management team Provided patient with written educational materials related to hypo and hyperglycemia and importance of correct treatment. Review of sx and sx of hypo and hyperglycemia. Email sent with information on hypot and hyperglycemia also Reviewed scheduled/upcoming provider appointments including: 11-26-2021 at 220 pm with pcp Advised patient, providing education and rationale, to check cbg as directed  and record, calling pcp for findings outside established parameters. The patient currently does not have a meter and has not been taking her blood sugars. Education provided on asking the pcp if this is something the patient should start doing. Review of goal of a fasting blood sugar of <130 and post prandial of <180.  Review of patient status, including review of consultants reports, relevant laboratory and other test results, and medications completed Screening for signs and symptoms of depression related to chronic disease state  Assessed social determinant of  health barriers Review of the Care coordination program and the role of the RNCM. The patient agrees to work with the [facility] and agrees to [employer]. She has contact information for the [facility] and will call between outreaches if new needs or concerns arise.  Next AWV scheduled for 05-25-2022           Our next appointment is by telephone on 12-09-2021 at 130 pm  Please call the care guide team at [phone] if you need to cancel or reschedule your appointment.   If you are experiencing a Mental Health or [place] or need someone to talk to, please call the Suicide and Crisis Lifeline: 988 call the [place] National Suicide Prevention Lifeline: 346-032-8399 or TTY: 606-725-3554 TTY 604-250-3325) to talk to a trained counselor call 1-800-273-TALK (toll free, 24 hour hotline)  Patient verbalizes understanding of instructions and care plan provided today and agrees to view in [place]. Active MyChart status and patient understanding of how to access instructions and care plan via MyChart confirmed with patient.     Telephone follow up appointment with care management team member scheduled for: 12-09-2021 at 130 pm  Noreene Larsson RN, MSN, [place] [facility] Mobile: [phone]    Diabetes Mellitus and Nutrition, Adult When you have diabetes, or diabetes mellitus, it is very important to have healthy eating habits because your blood sugar (glucose) levels are greatly affected by what you eat and drink. Eating healthy foods in the right amounts, at about the same times every day, can help you:  Manage your blood glucose. Lower your risk of heart disease. Improve your blood pressure. Reach or maintain a healthy weight. What can affect my meal plan? Every person with diabetes is different, and each person has different needs for a meal plan. Your health care provider may recommend that you work with a dietitian to make a meal plan that  is best for you. Your meal plan may vary depending on factors such as: The calories you need. The medicines you take. Your weight. Your blood glucose, blood pressure, and cholesterol levels. Your activity level. Other health conditions you have, such as heart or kidney disease. How do carbohydrates affect me? Carbohydrates, also called carbs, affect your blood glucose level more than any other type of food. Eating carbs raises the amount of glucose in your blood. It is important to know how many carbs you can safely have in each meal. This is different for every person. Your dietitian can help you calculate how many carbs you should have at each meal and for each snack. How does alcohol affect me? Alcohol can cause a decrease in blood glucose (hypoglycemia), especially if you use insulin or take certain diabetes medicines by mouth. Hypoglycemia can be a life-threatening condition. Symptoms of hypoglycemia, such as sleepiness, dizziness, and confusion, are similar to symptoms of having too much alcohol. Do not drink alcohol if: Your health care provider tells you not to drink. You are pregnant, may be pregnant, or are planning to become pregnant. If you drink alcohol: Limit how much you have to: 0-1 drink a day for women. 0-2 drinks a day for men. Know how much alcohol is in your drink. In the [place] one drink equals one 12 oz bottle of beer (355 mL), one 5 oz glass of wine (148 mL), or one 1 oz glass of hard liquor (44 mL). Keep yourself hydrated with water, diet soda, or unsweetened iced tea. Keep in mind that regular soda, juice, and other mixers may contain a lot of sugar and must be counted as carbs. What are tips for following this plan?  Reading food labels Start by checking the serving size on the Nutrition Facts label of packaged foods and drinks. The number of calories and the amount of carbs, fats, and other nutrients listed on the label are based on one serving of the item. Many  items contain more than one serving per package. Check the total grams (g) of carbs in one serving. Check the number of grams of saturated fats and trans fats in one serving. Choose foods that have a low amount or none of these fats. Check the number of milligrams (mg) of salt (sodium) in one serving. Most people should limit total sodium intake to less than 2,300 mg per day. Always check the nutrition information of foods labeled as "low-fat" or "nonfat." These foods may be higher in added sugar or refined carbs and should be avoided. Talk to your dietitian to identify your daily goals for nutrients listed on the label. Shopping Avoid buying canned, pre-made, or processed foods. These foods tend to be high in fat, sodium, and added sugar. Shop around the outside edge of the grocery store. This is where you will most often find fresh fruits and vegetables, bulk grains, fresh meats, and fresh dairy products. Cooking Use low-heat cooking methods, such as baking, instead of high-heat cooking methods, such as deep frying. Cook using healthy oils, such as olive, canola, or sunflower oil. Avoid cooking with butter, cream, or high-fat meats.  Meal planning Eat meals and snacks regularly, preferably at the same times every day. Avoid going long periods of time without eating. Eat foods that are high in fiber, such as fresh fruits, vegetables, beans, and whole grains. Eat 4-6 oz (112-168 g) of lean protein each day, such as lean meat, chicken, fish, eggs, or tofu. One ounce (oz) (28 g) of lean protein is equal to: 1 oz (28 g) of meat, chicken, or fish. 1 egg.  cup (62 g) of tofu. Eat some foods each day that contain healthy fats, such as avocado, nuts, seeds, and fish. What foods should I eat? Fruits Berries. Apples. Oranges. Peaches. Apricots. Plums. Grapes. Mangoes. Papayas. Pomegranates. Kiwi. Cherries. Vegetables Leafy greens, including lettuce, spinach, kale, chard, collard greens, mustard  greens, and cabbage. Beets. Cauliflower. Broccoli. Carrots. Green beans. Tomatoes. Peppers. Onions. Cucumbers. Brussels sprouts. Grains Whole grains, such as whole-wheat or whole-grain bread, crackers, tortillas, cereal, and pasta. Unsweetened oatmeal. Quinoa. Brown or wild rice. Meats and other proteins Seafood. Poultry without skin. Lean cuts of poultry and beef. Tofu. Nuts. Seeds. Dairy Low-fat or fat-free dairy products such as milk, yogurt, and cheese. The items listed above may not be a complete list of foods and beverages you can eat and drink. Contact a dietitian for more information. What foods should I avoid? Fruits Fruits canned with syrup. Vegetables Canned vegetables. Frozen vegetables with butter or cream sauce. Grains Refined white flour and flour products such as bread, pasta, snack foods, and cereals. Avoid all processed foods. Meats and other proteins Fatty cuts of meat. Poultry with skin. Breaded or fried meats. Processed meat. Avoid saturated fats. Dairy Full-fat yogurt, cheese, or milk. Beverages Sweetened drinks, such as soda or iced tea. The items listed above may not be a complete list of foods and beverages you should avoid. Contact a dietitian for more information. Questions to ask a health care provider Do I need to meet with a certified diabetes care and education specialist? Do I need to meet with a dietitian? What number can I call if I have questions? When are the best times to check my blood glucose? Where to find more information: American Diabetes Association: diabetes.org Academy of Nutrition and Dietetics: eatright.[employer] of Diabetes and Digestive and Kidney Diseases: [employer].is Association of Diabetes Care & Education Specialists: diabeteseducator.org Summary It is important to have healthy eating habits because your blood sugar (glucose) levels are greatly affected by what you eat and drink. It is important to use alcohol  carefully. A healthy meal plan will help you manage your blood glucose and lower your risk of heart disease. Your health care provider may recommend that you work with a dietitian to make a meal plan that is best for you. This information is not intended to replace advice given to you by your health care provider. Make sure you discuss any questions you have with your health care provider. Document Revised: 08/29/2019 Document Reviewed: 08/29/2019 Elsevier Patient Education  [place].   Hyperglycemia and How to Treat It Define hyperglycemia and describe how to treat it. To view the content, go to this web address: [email or web address]  This video will expire on: 08/01/2023. If you need access to this video following this date, please reach out to the healthcare provider who assigned it to you. This information is not intended to replace advice given to you by your health care provider. Make sure you discuss any questions you have with your health care provider. Elsevier Patient Education  [place].     Hyperglycemia Hyperglycemia is when the sugar (glucose) level in your blood is too high. High blood sugar can happen to people who have or do not have diabetes. High blood sugar can happen quickly. It can be an emergency. What are the causes? If you have diabetes, high blood sugar may be caused by: Medicines that increase blood sugar or affect your control of diabetes. Getting less physical activity. Overeating. Being sick or injured or having an infection. Having surgery. Stress. Not giving yourself enough insulin (if you are taking it). You may have high blood sugar because you have diabetes that has not been diagnosed yet. If you do not have diabetes, high blood sugar may be caused by: Certain medicines. Stress. A bad illness. An infection. Having surgery. Diseases of the pancreas. What increases the risk? This condition is more likely to  develop in people who have risk factors for diabetes, such as: Having a family member with diabetes. Certain conditions in which the body's defense system (immune system) attacks itself. These are called autoimmune disorders. Being overweight. Not being active. Having a condition called insulin resistance. Having a history of: Prediabetes. Diabetes when pregnant. Polycystic ovarian syndrome (PCOS). What are the signs or symptoms? This condition may not cause symptoms. If you do have symptoms, they may include: Feeling more thirsty than normal. Needing to pee (urinate) more often than normal. Hunger. Feeling very tired. Blurry eyesight (vision). You may get other symptoms as the condition gets worse, such as: Dry mouth. Pain in your belly (abdomen). Not being hungry (loss of appetite). Breath that smells fruity. Weakness. Weight loss that is not planned. A tingling or numb feeling in your hands or feet. A headache. Cuts or bruises that heal slowly. How is this treated? Treatment depends on the cause of your condition. Treatment may include: Taking medicine to control your blood sugar levels. Changing your medicine or dosage if you take insulin or other diabetes medicines. Lifestyle changes. These may include: Exercising more. Eating healthier foods. Losing weight. Treating an illness or infection. Checking your blood sugar more often. Stopping or reducing steroid medicines. If your condition gets very bad, you will need to be treated in the hospital. Follow these instructions at home: General instructions Take over-the-counter and prescription medicines only as told by your doctor. Do not smoke or use any products that contain nicotine or tobacco. If you need help quitting, ask your doctor. If you drink alcohol: Limit how much you have to: 0-1 drink a day for women who are not pregnant. 0-2 drinks a day for men. Know how much alcohol is in a drink. In the [place] one  drink equals one 12 oz bottle of beer (355 mL), one 5 oz glass of wine (148 mL), or one 1 oz glass of hard liquor (44 mL). Manage stress. If you need help with this, ask your doctor. Do exercises as told by your doctor. Keep all follow-up visits. Eating and drinking  Stay at a healthy weight. Make sure you drink enough fluid when you: Exercise. Get sick. Are in hot temperatures. Drink enough fluid to keep your pee (urine) pale yellow. If you have diabetes:  Know the symptoms of high blood sugar. Follow your diabetes management plan as told by your doctor. Make sure you: Take insulin and medicines as told. Follow your exercise plan. Follow your meal plan. Eat on time. Do not skip meals. Check your blood sugar as often as told. Make sure  you check before and after exercise. If you exercise longer or in a different way, check your blood sugar more often. Follow your sick day plan whenever you cannot eat or drink normally. Make this plan ahead of time with your doctor. Share your diabetes management plan with people in your workplace, school, and household. Check your pee for ketones when you are ill and as told by your doctor. Carry a card or wear jewelry that says that you have diabetes. Where to find more information American Diabetes Association: www.diabetes.org Contact a doctor if: Your blood sugar level is at or above 240 mg/dL (13.3 mmol/L) for 2 days in a row. You have problems keeping your blood sugar in your target range. You have high blood pressure often. You have signs of illness, such as: Feeling like you may vomit (feeling nauseous). Vomiting. A fever. Get help right away if: Your blood sugar monitor reads "high" even when you are taking insulin. You have trouble breathing. You have a change in how you think, feel, or act (mental status). You feel like you may vomit, and the feeling does not go away. You cannot stop vomiting. These symptoms may be an emergency.  Get medical help right away. Call your local emergency services (911 in the [place] Do not wait to see if the symptoms will go away. Do not drive yourself to the hospital. Summary Hyperglycemia is when the sugar (glucose) level in your blood is too high. High blood sugar can happen to people who have or do not have diabetes. Make sure you drink enough fluids and follow your meal plan. Exercise as often as told by your doctor. Contact your doctor if you have problems keeping your blood sugar in your target range. This information is not intended to replace advice given to you by your health care provider. Make sure you discuss any questions you have with your health care provider. Document Revised: 11/09/2019 Document Reviewed: 11/09/2019 Elsevier Patient Education  [place]. Hypoglycemia and How to Treat It In this video you will learn about hypoglycemia and how it is managed. To view the content, go to this web address: [email or web address]  This video will expire on: 08/01/2023. If you need access to this video following this date, please reach out to the healthcare provider who assigned it to you. This information is not intended to replace advice given to you by your health care provider. Make sure you discuss any questions you have with your health care provider. Elsevier Patient Education  [place]. Hypoglycemia Hypoglycemia occurs when the level of sugar (glucose) in the blood is too low. Hypoglycemia can happen in people who have or do not have diabetes. It can develop quickly, and it can be a medical emergency. For most people, a blood glucose level below 70 mg/dL (3.9 mmol/L) is considered hypoglycemia. Glucose is a type of sugar that provides the body's main source of energy. Certain hormones (insulin and glucagon) control the level of glucose in the blood. Insulin lowers blood glucose, and glucagon raises blood glucose. Hypoglycemia can result from  having too much insulin in the bloodstream, or from not eating enough food that contains glucose. You may also have reactive hypoglycemia, which happens within 4 hours after eating a meal. What are the causes? Hypoglycemia occurs most often in people who have diabetes and may be caused by: Diabetes medicine. Not eating enough, or not eating often enough. Increased physical activity. Drinking alcohol on an empty stomach. If you do  not have diabetes, hypoglycemia may be caused by: A tumor in the pancreas. Not eating enough, or not eating for long periods at a time (fasting). A severe infection or illness. Problems after having bariatric surgery. Organ failure, such as kidney or liver failure. Certain medicines. What increases the risk? Hypoglycemia is more likely to develop in people who: Have diabetes and take medicines to lower blood glucose. Abuse alcohol. Have a severe illness. What are the signs or symptoms? Symptoms vary depending on whether the condition is mild, moderate, or severe. Mild hypoglycemia Hunger. Sweating and feeling clammy. Dizziness or feeling light-headed. Sleepiness or restless sleep. Nausea. Increased heart rate. Headache. Blurry vision. Mood changes, such as irritability or anxiety. Tingling or numbness around the mouth, lips, or tongue. Moderate hypoglycemia Confusion and poor judgment. Behavior changes. Weakness. Irregular heartbeat. A change in coordination. Severe hypoglycemia Severe hypoglycemia is a medical emergency. It can cause: Fainting. Seizures. Loss of consciousness (coma). Death. How is this diagnosed? Hypoglycemia is diagnosed with a blood test to measure your blood glucose level. This blood test is done while you are having symptoms. Your health care provider may also do a physical exam and review your medical history. How is this treated? This condition can be treated by immediately eating or drinking something that contains  sugar with 15 grams of fast-acting carbohydrate, such as: 4 oz (120 mL) of fruit juice. 4 oz (120 mL) of regular soda (not diet soda). Several pieces of hard candy. Check food labels to find out how many pieces to eat for 15 grams. 1 Tbsp (15 mL) of sugar or honey. 4 glucose tablets. 1 tube of glucose gel. Treating hypoglycemia if you have diabetes If you are alert and able to swallow safely, follow the 15:15 rule: Take 15 grams of a fast-acting carbohydrate. Talk with your health care provider about how much you should take. Options for getting 15 grams of fast-acting carbohydrate include: Glucose tablets (take 4 tablets). Several pieces of hard candy. Check food labels to find out how many pieces to eat for 15 grams. 4 oz (120 mL) of fruit juice. 4 oz (120 mL) of regular soda (not diet soda). 1 Tbsp (15 mL) of sugar or honey. 1 tube of glucose gel. Check your blood glucose 15 minutes after you take the carbohydrate. If the repeat blood glucose level is still at or below 70 mg/dL (3.9 mmol/L), take 15 grams of a carbohydrate again. If your blood glucose level does not increase above 70 mg/dL (3.9 mmol/L) after 3 tries, seek emergency medical care. After your blood glucose level returns to normal, eat a meal or a snack within 1 hour.  Treating severe hypoglycemia Severe hypoglycemia is when your blood glucose level is below 54 mg/dL (3 mmol/L). Severe hypoglycemia is a medical emergency. Get medical help right away. If you have severe hypoglycemia and you cannot eat or drink, you will need to be given glucagon. A family member or close friend should learn how to check your blood glucose and how to give you glucagon. Ask your health care provider if you need to have an emergency glucagon kit available. Severe hypoglycemia may need to be treated in a hospital. The treatment may include getting glucose through an IV. You may also need treatment for the cause of your hypoglycemia. Follow these  instructions at home:  General instructions Take over-the-counter and prescription medicines only as told by your health care provider. Monitor your blood glucose as told by your health care provider.  If you drink alcohol: Limit how much you have to: 0-1 drink a day for women who are not pregnant. 0-2 drinks a day for men. Know how much alcohol is in your drink. In the [place] one drink equals one 12 oz bottle of beer (355 mL), one 5 oz glass of wine (148 mL), or one 1 oz glass of hard liquor (44 mL). Be sure to eat food along with drinking alcohol. Be aware that alcohol is absorbed quickly and may have lingering effects that may result in hypoglycemia later. Be sure to do ongoing glucose monitoring. Keep all follow-up visits. This is important. If you have diabetes: Always have a fast-acting carbohydrate (15 grams) option with you to treat low blood glucose. Follow your diabetes management plan as directed by your health care provider. Make sure you: Know the symptoms of hypoglycemia. It is important to treat it right away to prevent it from becoming severe. Check your blood glucose as often as told. Always check before and after exercise. Always check your blood glucose before you drive a motorized vehicle. Take your medicines as told. Follow your meal plan. Eat on time, and do not skip meals. Share your diabetes management plan with people in your workplace, school, and household. Carry a medical alert card or wear medical alert jewelry. Where to find more information American Diabetes Association: www.diabetes.org Contact a health care provider if: You have problems keeping your blood glucose in your target range. You have frequent episodes of hypoglycemia. Get help right away if: You continue to have hypoglycemia symptoms after eating or drinking something that contains 15 grams of fast-acting carbohydrate, and you cannot get your blood glucose above 70 mg/dL (3.9 mmol/L) while  following the 15:15 rule. Your blood glucose is below 54 mg/dL (3 mmol/L). You have a seizure. You faint. These symptoms may represent a serious problem that is an emergency. Do not wait to see if the symptoms will go away. Get medical help right away. Call your local emergency services (911 in the [place] Do not drive yourself to the hospital. Summary Hypoglycemia occurs when the level of sugar (glucose) in the blood is too low. Hypoglycemia can happen in people who have or do not have diabetes. It can develop quickly, and it can be a medical emergency. Make sure you know the symptoms of hypoglycemia and how to treat it. Always have a fast-acting carbohydrate option with you to treat low blood sugar. This information is not intended to replace advice given to you by your health care provider. Make sure you discuss any questions you have with your health care provider. Document Revised: 12/27/2019 Document Reviewed: 12/27/2019 Elsevier Patient Education  [place].

## 2021-10-23 ENCOUNTER — Other Ambulatory Visit
Admission: RE | Admit: 2021-10-23 | Discharge: 2021-10-23 | Disposition: A | Discharge status: Home / Self Care | Payer: Medicare HMO | Source: Ambulatory Visit | Attending: Internal Medicine | Admitting: Internal Medicine

## 2021-10-23 ENCOUNTER — Ambulatory Visit: Payer: Self-pay

## 2021-10-23 ENCOUNTER — Inpatient Hospital Stay: Admission: RE | Admit: 2021-10-23 | Payer: Self-pay | Source: Ambulatory Visit

## 2021-10-23 DIAGNOSIS — R0600 Dyspnea, unspecified: Secondary | ICD-10-CM | POA: Diagnosis not present

## 2021-10-23 DIAGNOSIS — K449 Diaphragmatic hernia without obstruction or gangrene: Secondary | ICD-10-CM | POA: Diagnosis not present

## 2021-10-23 DIAGNOSIS — I2510 Atherosclerotic heart disease of native coronary artery without angina pectoris: Secondary | ICD-10-CM | POA: Diagnosis not present

## 2021-10-23 DIAGNOSIS — R69 Illness, unspecified: Secondary | ICD-10-CM | POA: Diagnosis not present

## 2021-10-23 DIAGNOSIS — I252 Old myocardial infarction: Secondary | ICD-10-CM | POA: Diagnosis not present

## 2021-10-23 LAB — D-DIMER, QUANTITATIVE: D-Dimer, Quant: 0.27 ug/mL-FEU (ref 0.00–0.50)

## 2021-10-23 LAB — TROPONIN I (HIGH SENSITIVITY): Troponin I (High Sensitivity): 13 ng/L (ref <18)

## 2021-10-23 NOTE — Telephone Encounter (Signed)
    Chief Complaint: Difficulty taking deep breath. Symptoms: Above Frequency: Yesterday Pertinent Negatives: Patient denies chest pain Disposition: [] ED /[x] Urgent Care (no appt availability in office) / [] Appointment(In office/virtual)/ []  [place] [facility]/ [] Home Care/ [] Refused Recommended Disposition /[] [place] [facility]/ []  Follow-up with PCP Additional Notes:   Reason for Disposition  [1] MILD difficulty breathing (e.g., minimal/no SOB at rest, SOB with walking, pulse <100) AND [2] NEW-onset or WORSE than normal  Answer Assessment - Initial Assessment Questions 1. RESPIRATORY STATUS: "Describe your breathing?" (e.g., wheezing, shortness of breath, unable to speak, severe coughing)      Can't take a deep breath 2. ONSET: "When did this breathing problem begin?"      Yesterday 3. PATTERN "Does the difficult breathing come and go, or has it been constant since it started?"      Comes and goes 4. SEVERITY: "How bad is your breathing?" (e.g., mild, moderate, severe)    - MILD: No SOB at rest, mild SOB with walking, speaks normally in sentences, can lie down, no retractions, pulse < 100.    - MODERATE: SOB at rest, SOB with minimal exertion and prefers to sit, cannot lie down flat, speaks in phrases, mild retractions, audible wheezing, pulse 100-120.    - SEVERE: Very SOB at rest, speaks in single words, struggling to breathe, sitting hunched forward, retractions, pulse > 120      Mild 5. RECURRENT SYMPTOM: "Have you had difficulty breathing before?" If Yes, ask: "When was the last time?" and "What happened that time?"      No 6. CARDIAC HISTORY: "Do you have any history of heart disease?" (e.g., heart attack, angina, bypass surgery, angioplasty)      Stent 7. LUNG HISTORY: "Do you have any history of lung disease?"  (e.g., pulmonary embolus, asthma, emphysema)     No 8. CAUSE: "What do you think is causing the breathing problem?"      Unsure 9. OTHER SYMPTOMS: "Do you  have any other symptoms? (e.g., dizziness, runny nose, cough, chest pain, fever)     No 10. O2 SATURATION MONITOR:  "Do you use an oxygen saturation monitor (pulse oximeter) at home?" If Yes, ask: "What is your reading (oxygen level) today?" "What is your usual oxygen saturation reading?" (e.g., 95%)       No 11. PREGNANCY: "Is there any chance you are pregnant?" "When was your last menstrual period?"       No 12. TRAVEL: "Have you traveled out of the country in the last month?" (e.g., travel history, exposures)       No  Protocols used: Breathing Difficulty-A-AH

## 2021-10-23 NOTE — Telephone Encounter (Signed)
Just and FYI. Gilbert's patient.

## 2021-10-23 NOTE — Telephone Encounter (Signed)
Agree with rec to UC

## 2021-10-23 NOTE — Patient Instructions (Signed)
Visit Information  Thank you for taking time to visit with me today. Please don't hesitate to contact me if I can be of assistance to you.   Following are the goals we discussed today:   Goals Addressed             This Visit's Progress    RNCM: "Having a hard time getting a deep breath"       Care Coordination Interventions: Evaluation of current treatment plan related to patient feeling that she cannot get a deep breath and patient's adherence to plan as established by provider Advised patient to go to the urgent care to be seen. The patient has called the office and the patient cannot be seen in the office today. Education on urgent care, [facility], or online availability with a provider  Provided education to patient re: mindfulness, breathing exercises. Will provide breathing exercises on myChart information Reviewed medications with patient and discussed compliance. The patient does not use any inhalers, review of all medications and education provided  Provided patient with purse lip breathing educational materials related to the patient feeling she cannot get a good breath Reviewed scheduled/upcoming provider appointments including 11-26-2021 with pcp. Advised the patient to go to urgent care for immediate needs that are non-emergent. The patient does not want to go to the ER due to increase risk of COVID Discussed plans with patient for ongoing care management follow up and provided patient with direct contact information for care management team Advised patient to discuss questions and concerns  with provider Active listening / Reflection utilized  Emotional Support Provided         Our next appointment is by telephone on 12-09-2021 at 130 pm  Please call the care guide team at [phone] if you need to cancel or reschedule your appointment.   If you are experiencing a Mental Health or Behavioral Health Crisis or need someone to talk to, please call the Suicide and  Crisis Lifeline: 988 call the [place] National Suicide Prevention Lifeline: (949)076-0168 or TTY: 873 725 3955 TTY 704-181-2657) to talk to a trained counselor call 1-800-273-TALK (toll free, 24 hour hotline)  Patient verbalizes understanding of instructions and care plan provided today and agrees to view in MyChart. Active MyChart status and patient understanding of how to access instructions and care plan via MyChart confirmed with patient.     Telephone follow up appointment with care management team member scheduled for: 12-09-2021 at 130 pm  Alto Denver RN, MSN, CCM Community Care Coordinator [facility]  [facility] Mobile: [phone]    Mindfulness-Based Stress Reduction Mindfulness-based stress reduction (MBSR) is a program that helps people learn to practice mindfulness. Mindfulness is the practice of consciously paying attention to the present moment. MBSR focuses on developing self-awareness, which lets you respond to life stress without judgment or negative feelings. It can be learned and practiced through techniques such as education, breathing exercises, meditation, and yoga. MBSR includes several mindfulness techniques in one program. MBSR works best when you understand the treatment, are willing to try new things, and can commit to spending time practicing what you learn. MBSR training may include learning about: How your feelings, thoughts, and reactions affect your body. New ways to respond to things that cause negative thoughts to start (triggers). How to notice your thoughts and let go of them. Practicing awareness of everyday things that you normally do without thinking. The techniques and goals of different types of meditation. What are the benefits of MBSR?  MBSR can have many benefits, which include helping you to: Develop self-awareness. This means knowing and understanding yourself. Learn skills and attitudes that help you to take part in your own health  care. Learn new ways to care for yourself. Be more accepting about how things are, and let things go. Be less judgmental and approach things with an open mind. Be patient with yourself and trust yourself more. MBSR has also been shown to: Reduce negative emotions, such as sadness, overwhelm, and worry. Improve memory and focus. Change how you sense and react to pain. Boost your body's ability to fight infections. Help you connect better with other people. Improve your sense of well-being. How to practice mindfulness To do a basic awareness exercise: Find a comfortable place to sit. Pay attention to the present moment. Notice your thoughts, feelings, and surroundings just as they are. Avoid judging yourself, your feelings, or your surroundings. Make note of any judgment that comes up and let it go. Your mind may wander, and that is okay. Make note of when your thoughts drift, and return your attention to the present moment. To do basic mindfulness meditation: Find a comfortable place to sit. This may include a stable chair or a firm floor cushion. Sit upright with your back straight. Let your arms fall next to your sides, with your hands resting on your legs. If you are sitting in a chair, rest your feet flat on the floor. If you are sitting on a cushion, cross your legs in front of you. Keep your head in a neutral position with your chin dropped slightly. Relax your jaw and rest the tip of your tongue on the roof of your mouth. Drop your gaze to the floor or close your eyes. Breathe normally and pay attention to your breath. Feel the air moving in and out of your nose. Feel your belly expanding and relaxing with each breath. Your mind may wander, and that is okay. Make note of when your thoughts drift, and return your attention to your breath. Avoid judging yourself, your feelings, or your surroundings. Make note of any judgment or feelings that come up, let them go, and bring your  attention back to your breath. When you are ready, lift your gaze or open your eyes. Pay attention to how your body feels after the meditation. Follow these instructions at home:  Find a local in-person or online MBSR program. Set aside some time regularly for mindfulness practice. Practice every day if you can. Even 10 minutes of practice is helpful. Find a mindfulness practice that works best for you. This may include one or more of the following: Meditation. This involves focusing your mind on a certain thought or activity. Breathing awareness exercises. These help you to stay present by focusing on your breath. Body scan. For this practice, you lie down and pay attention to each part of your body from head to toe. You can identify tension and soreness and consciously relax parts of your body. Yoga. Yoga involves stretching and breathing, and it can improve your ability to move and be flexible. It can also help you to test your body's limits, which can help you release stress. Mindful eating. This way of eating involves focusing on the taste, texture, color, and smell of each bite of food. This slows down eating and helps you feel full sooner. For this reason, it can be an important part of a weight loss plan. Find a podcast or recording that provides guidance for breathing  awareness, body scan, or meditation exercises. You can listen to these any time when you have a free moment to rest without distractions. Follow your treatment plan as told by your health care provider. This may include taking regular medicines and making changes to your diet or lifestyle as recommended. Where to find more information You can find more information about MBSR from: Your health care provider. Community-based meditation centers or programs. Programs offered near you. Summary Mindfulness-based stress reduction (MBSR) is a program that teaches you how to consciously pay attention to the present moment. It is  used to help you deal better with daily stress, feelings, and pain. MBSR focuses on developing self-awareness, which allows you to respond to life stress without judgment or negative feelings. MBSR programs may involve learning different mindfulness practices, such as breathing exercises, meditation, yoga, body scan, or mindful eating. Find a mindfulness practice that works best for you, and set aside time for it on a regular basis. This information is not intended to replace advice given to you by your health care provider. Make sure you discuss any questions you have with your health care provider. Document Revised: 09/04/2020 Document Reviewed: 09/04/2020 Elsevier Patient Education  2023 [employer] Inc.   Pursed Lip Breathing Pursed lip breathing is a technique to relieve the feeling of being short of breath. Some long-term respiratory conditions, such as chronic obstructive pulmonary disease (COPD) and severe asthma, can make it hard to breathe out (exhale) all the air in your lungs. This can cause air that has less oxygen than normal to build up in your lungs (air trapping). Trapped air means your lungs fill with less fresh air when you breathe in, or inhale. As a result, you feel short of breath. Pursed lip breathing keeps your airways open longer when you exhale and empties more air from your lungs. This makes more space for fresh air when you inhale. Pursed lip breathing can also slow down your breathing and keep your body from having to work so hard to breathe. Over time, pursed lip breathing may help you be able to be more physically active and do more activities. You should use this breathing technique during the most difficult part of any activity, such as when bending over or climbing stairs. How to perform pursed lip breathing Being short of breath can make you tense and anxious. Before you start this breathing exercise, take a minute to relax your shoulders and close your eyes. Then: Start  the exercise by closing your mouth. Breathe in through your nose, taking a normal breath. You can do this at your normal rate of breathing. If you feel you are not getting enough air, breathe in while slowly counting to 2 or 3. Pucker (purse) your lips as if you were going to whistle. Gently tighten the muscles of your abdomenor press on your abdomen to help push the air out. Breathe out slowly through your pursed lips. Take at least twice as long to breathe out as it takes you to breathe in. Make sure that you breathe out all of the air, but do not force air out. Ask your health care provider how often and how long to do this exercise. Follow these instructions at home: Take over-the-counter and prescription medicines only as told by your health care provider. Do not use any products that contain nicotine or tobacco. These products include cigarettes, chewing tobacco, and vaping devices, such as e-cigarettes. If you need help quitting, ask your health care provider. Return  to your normal activities as told by your health care provider. Ask your health care provider what activities are safe for you. Keep all follow-up visits. This is important. Where to find more information American Lung Association: lung.org Contact a health care provider if: Your shortness of breath gets worse. You become less able to exercise or be physically active. You develop a cough. You develop a fever. You experience problems with this breathing technique. Get help right away if: You are struggling to breathe. Your shortness of breath prevents you from doing any activity. These symptoms may represent a serious problem that is an emergency. Do not wait to see if the symptoms will go away. Get medical help right away. Call your local emergency services (911 in the [place] Do not drive yourself to the hospital. Summary Pursed lip breathing is a breathing technique that helps to remove trapped air from your lungs. This  technique helps you get more oxygen into your lungs. Pursed lip breathing can help slow down your breathing and keeps your body from having to work so hard to breathe. When performing this technique, take at least twice as long to breathe out as it takes you to breathe in. This information is not intended to replace advice given to you by your health care provider. Make sure you discuss any questions you have with your health care provider. Document Revised: 01/28/2020 Document Reviewed: 01/28/2020 Elsevier Patient Education  2023 [employer].

## 2021-10-23 NOTE — Patient Outreach (Signed)
  Care Coordination   Follow Up Visit Note   10/23/2021 Name: Sheila GOOGE MRN: [id number] DOB: 05/09/1950  Sheila Herrera is a 71 y.o. year old female who sees Maple Hudson., MD for primary care. I spoke with  Sheila Herrera by phone today.  What matters to the patients health and wellness today?  The patient does not feel she can get a good breath when she breaths deeply    Goals Addressed             This Visit's Progress    RNCM: "Having a hard time getting a deep breath"       Care Coordination Interventions: Evaluation of current treatment plan related to patient feeling that she cannot get a deep breath and patient's adherence to plan as established by provider Advised patient to go to the urgent care to be seen. The patient has called the office and the patient cannot be seen in the office today. Education on urgent care, [facility], or online availability with a provider  Provided education to patient re: mindfulness, breathing exercises. Will provide breathing exercises on myChart information Reviewed medications with patient and discussed compliance. The patient does not use any inhalers, review of all medications and education provided  Provided patient with purse lip breathing educational materials related to the patient feeling she cannot get a good breath Reviewed scheduled/upcoming provider appointments including 11-26-2021 with pcp. Advised the patient to go to urgent care for immediate needs that are non-emergent. The patient does not want to go to the ER due to increase risk of COVID Discussed plans with patient for ongoing care management follow up and provided patient with direct contact information for care management team Advised patient to discuss questions and concerns  with provider Active listening / Reflection utilized  Emotional Support Provided         SDOH assessments and interventions completed:  No     Care  Coordination Interventions Activated:  Yes  Care Coordination Interventions:  Yes, provided   Follow up plan: Follow up call scheduled for with pcp on 11-26-2021 at 220 pm and 12-09-2021 at 130 pm with RNCM    Encounter Outcome:  Pt. Visit Completed   Alto Denver RN, MSN, CCM Community Care Coordinator [facility]  [facility] Mobile: [phone]

## 2021-10-26 ENCOUNTER — Ambulatory Visit: Payer: Self-pay

## 2021-10-26 ENCOUNTER — Ambulatory Visit (INDEPENDENT_AMBULATORY_CARE_PROVIDER_SITE_OTHER): Payer: Medicare HMO | Admitting: Family Medicine

## 2021-10-26 ENCOUNTER — Encounter: Payer: Self-pay | Admitting: Family Medicine

## 2021-10-26 VITALS — BP 124/82 | HR 79 | Resp 16 | Wt 171.0 lb

## 2021-10-26 DIAGNOSIS — R0609 Other forms of dyspnea: Secondary | ICD-10-CM | POA: Diagnosis not present

## 2021-10-26 DIAGNOSIS — I2510 Atherosclerotic heart disease of native coronary artery without angina pectoris: Secondary | ICD-10-CM

## 2021-10-26 NOTE — Telephone Encounter (Signed)
  Chief Complaint: Can't take a deep breath Symptoms: above Frequency: 10/23/2021 Pertinent Negatives: Patient denies  Disposition: [] ED /[] Urgent Care (no appt availability in office) / [x] Appointment(In office/virtual)/ []  [place] [facility]/ [] Home Care/ [] Refused Recommended Disposition /[] [place] [facility]/ []  Follow-up with PCP Additional Notes: Pt is unchanged from last week. Pt was advised to go to UC. Pt went to UC, but problem remains unresolved. Pt states she cannot take a deep breath. Pt is tearful.     Reason for Disposition  [1] Longstanding difficulty breathing (e.g., CHF, COPD, emphysema) AND [2] WORSE than normal  Answer Assessment - Initial Assessment Questions 1. RESPIRATORY STATUS: "Describe your breathing?" (e.g., wheezing, shortness of breath, unable to speak, severe coughing)      Can't take a deep breath 2. ONSET: "When did this breathing problem begin?"      Last week 3. PATTERN "Does the difficult breathing come and go, or has it been constant since it started?"      Comes and goes 4. SEVERITY: "How bad is your breathing?" (e.g., mild, moderate, severe)    - MILD: No SOB at rest, mild SOB with walking, speaks normally in sentences, can lie down, no retractions, pulse < 100.    - MODERATE: SOB at rest, SOB with minimal exertion and prefers to sit, cannot lie down flat, speaks in phrases, mild retractions, audible wheezing, pulse 100-120.    - SEVERE: Very SOB at rest, speaks in single words, struggling to breathe, sitting hunched forward, retractions, pulse > 120      mild 5. RECURRENT SYMPTOM: "Have you had difficulty breathing before?" If Yes, ask: "When was the last time?" and "What happened that time?"       6. CARDIAC HISTORY: "Do you have any history of heart disease?" (e.g., heart attack, angina, bypass surgery, angioplasty)      stent 7. LUNG HISTORY: "Do you have any history of lung disease?"  (e.g., pulmonary embolus, asthma, emphysema)       8. CAUSE: "What do you think is causing the breathing problem?"      Thyroid? 9. OTHER SYMPTOMS: "Do you have any other symptoms? (e.g., dizziness, runny nose, cough, chest pain, fever)      10. O2 SATURATION MONITOR:  "Do you use an oxygen saturation monitor (pulse oximeter) at home?" If Yes, ask: "What is your reading (oxygen level) today?" "What is your usual oxygen saturation reading?" (e.g., 95%)        11. PREGNANCY: "Is there any chance you are pregnant?" "When was your last menstrual period?"        12. TRAVEL: "Have you traveled out of the country in the last month?" (e.g., travel history, exposures)  Protocols used: Breathing Difficulty-A-AH

## 2021-10-26 NOTE — Patient Instructions (Addendum)
TRY OVER-THE-COUNTER BENADRYL 25 MG, 1-2 CAPSULES EVERY 4 HOURS AS NEEDED.

## 2021-10-26 NOTE — Progress Notes (Signed)
Established patient visit  I,April Miller,acting as a scribe for Wilhemena Durie, MD.,have documented all relevant documentation on the behalf of Wilhemena Durie, MD,as directed by  Wilhemena Durie, MD while in the presence of Wilhemena Durie, MD.   Patient: Sheila Herrera   DOB: [birth year]   71 y.o. Female  MRN: [id number] Visit Date: 10/26/2021  Today's healthcare provider: Wilhemena Durie, MD   Chief Complaint  Patient presents with   Shortness of Breath   Subjective    HPI  Patient stated she has had shortness of breath and tightness in her throat for 7 days. Patient was seen at [facility] on Friday. [place] did labs, chest-ray, and ekg. Patient states they could not find anything wrong. Patient is concerned because her eats a granola bar with peanuts the day symptoms began patient also has concern about the increase in levothyroxine 50 to 75 mcg on 08/04/2021. Patient has a symptom not being able to get enough of the deep breath Medications: Outpatient Medications Prior to Visit  Medication Sig   Alirocumab (PRALUENT) 150 MG/ML SOAJ Inject into the skin.   ALPRAZolam (XANAX) 0.25 MG tablet Take by mouth.   amLODipine (NORVASC) 5 MG tablet Take 1 tablet by mouth daily.   aspirin 81 MG chewable tablet Chew 81 mg by mouth.   cyanocobalamin (,VITAMIN B-12,) 1000 MCG/ML injection Inject 1 mL Once Weekly Until June 2023   ergocalciferol (VITAMIN D2) 1.25 MG (50000 UT) capsule Take 1 capsule once a week for 8 weeks.   estradiol (ESTRACE) 0.1 MG/GM vaginal cream Estrogen Cream Instruction Discard applicator Apply pea sized amount to tip of finger to urethra before bed. Wash hands well after application. Use Monday, Wednesday and Friday   hydrochlorothiazide (HYDRODIURIL) 25 MG tablet hydrochlorothiazide 25 mg tablet  TAKE 1 TABLET BY MOUTH EVERY DAY   levothyroxine (SYNTHROID) 75 MCG tablet Take 1 tablet (75 mcg total) by mouth daily before breakfast. Brand Name Only    meloxicam (MOBIC) 7.5 MG tablet TAKE 1 TABLET BY MOUTH EVERY DAY   metoprolol succinate (TOPROL-XL) 25 MG 24 hr tablet Take 12.5 mg by mouth.   nitroGLYCERIN (NITROSTAT) 0.4 MG SL tablet    telmisartan (MICARDIS) 20 MG tablet Take 20 mg by mouth daily.   No facility-administered medications prior to visit.    Review of Systems  Last lipids No results found for: "CHOL", "HDL", "LDLCALC", "LDLDIRECT", "TRIG", "CHOLHDL"     Objective    BP 124/82 (BP Location: Right Arm, Patient Position: Sitting, Cuff Size: Normal)   Pulse 79   Resp 16   Wt 171 lb (77.6 kg)   SpO2 98%   BMI 30.29 kg/m  BP Readings from Last 3 Encounters:  10/26/21 124/82  08/26/21 128/80  08/04/21 129/74   Wt Readings from Last 3 Encounters:  10/26/21 171 lb (77.6 kg)  08/26/21 171 lb 1.6 oz (77.6 kg)  08/04/21 171 lb (77.6 kg)      Physical Exam Vitals reviewed.  Constitutional:      General: She is not in acute distress.    Appearance: She is well-developed.  HENT:     Head: Normocephalic and atraumatic.     Right Ear: Hearing normal.     Left Ear: Hearing normal.     Nose: Nose normal.  Eyes:     General: Lids are normal. No scleral icterus.       Right eye: No discharge.  Left eye: No discharge.     Conjunctiva/sclera: Conjunctivae normal.  Cardiovascular:     Rate and Rhythm: Normal rate and regular rhythm.     Heart sounds: Normal heart sounds.  Pulmonary:     Effort: Pulmonary effort is normal. No respiratory distress.  Skin:    Findings: No lesion or rash.  Neurological:     General: No focal deficit present.     Mental Status: She is alert and oriented to person, place, and time.  Psychiatric:        Mood and Affect: Mood normal.        Speech: Speech normal.        Behavior: Behavior normal.        Thought Content: Thought content normal.        Judgment: Judgment normal.       No results found for any visits on 10/26/21.  Assessment & Plan     1. Other form of  dyspnea Think this may be a little anxiety history of CAD.  There is no evidence at all that she has any type of CAD or respiratory or PE. Work up Further as indicated. Patient is comfortable and agreeable with this plan. 2. Coronary artery disease involving native coronary artery of native heart without angina pectoris Risk factors treated   No follow-ups on file.      I, Wilhemena Durie, MD, have reviewed all documentation for this visit. The documentation on 10/31/21 for the exam, diagnosis, procedures, and orders are all accurate and complete.    Jesson Foskey Cranford Mon, MD  [facility] [phone] (phone) [phone] (fax)  [place]

## 2021-11-11 ENCOUNTER — Ambulatory Visit (INDEPENDENT_AMBULATORY_CARE_PROVIDER_SITE_OTHER): Payer: Medicare HMO

## 2021-11-11 DIAGNOSIS — E538 Deficiency of other specified B group vitamins: Secondary | ICD-10-CM | POA: Diagnosis not present

## 2021-11-11 MED ORDER — CYANOCOBALAMIN 1000 MCG/ML IJ SOLN
1000.0000 ug | Freq: Once | INTRAMUSCULAR | Status: AC
  Administered 2021-11-11: 1000 ug via INTRAMUSCULAR

## 2021-11-13 ENCOUNTER — Other Ambulatory Visit: Payer: Self-pay | Admitting: Family Medicine

## 2021-11-13 NOTE — Telephone Encounter (Signed)
Requested Prescriptions  Pending Prescriptions Disp Refills  . SYNTHROID 75 MCG tablet [Pharmacy Med Name: SYNTHROID 75 MCG TABLET] 90 tablet 0    Sig: TAKE 1 TABLET (75 MCG TOTAL) BY MOUTH DAILY BEFORE BREAKFAST.     Endocrinology:  Hypothyroid Agents Failed - 11/13/2021  8:18 AM      Failed - TSH in normal range and within 360 days    TSH  Date Value Ref Range Status  08/04/2021 8.650 (H) 0.450 - 4.500 uIU/mL Final         Passed - Valid encounter within last 12 months    Recent Outpatient Visits          2 weeks ago Other form of dyspnea   [facility] Jerrol Banana., MD   2 months ago Hypertension, unspecified type   [place] Jerrol Banana., MD   3 months ago Dislocation of temporomandibular joint, initial encounter   [facility] Jerrol Banana., MD   5 months ago B12 deficiency   [facility] Jerrol Banana., MD   9 months ago [place] Gwyneth Sprout, FNP      Future Appointments            In 1 week Simmons-Robinson, Riki Sheer, MD [facility], [place]

## 2021-11-13 NOTE — Telephone Encounter (Signed)
Pt has only 2 levothyroxine (SYNTHROID) 75 MCG tablet left and Dr Rosanna Randy last filled. Pt has appt w/ Dr Quentin Cornwall on 10/19.

## 2021-11-13 NOTE — Telephone Encounter (Signed)
Requested medications are due for refill today.  yes  Requested medications are on the active medications list.  yes  Last refill. 08/10/2021 #90 0 rf  Future visit scheduled.   yes  Notes to clinic.  Abnormal labs.    Requested Prescriptions  Pending Prescriptions Disp Refills   SYNTHROID 75 MCG tablet [Pharmacy Med Name: SYNTHROID 75 MCG TABLET] 90 tablet 0    Sig: TAKE 1 TABLET (75 MCG TOTAL) BY MOUTH DAILY BEFORE BREAKFAST.     Endocrinology:  Hypothyroid Agents Failed - 11/13/2021  8:18 AM      Failed - TSH in normal range and within 360 days    TSH  Date Value Ref Range Status  08/04/2021 8.650 (H) 0.450 - 4.500 uIU/mL Final         Passed - Valid encounter within last 12 months    Recent Outpatient Visits           2 weeks ago Other form of dyspnea   [facility] Jerrol Banana., MD   2 months ago Hypertension, unspecified type   [facility] Jerrol Banana., MD   3 months ago Dislocation of temporomandibular joint, initial encounter   [facility] Jerrol Banana., MD   5 months ago B12 deficiency   [facility] Jerrol Banana., MD   9 months ago [place] Gwyneth Sprout, FNP       Future Appointments             In 1 week Simmons-Robinson, Riki Sheer, MD [facility], [place]

## 2021-11-17 DIAGNOSIS — E1165 Type 2 diabetes mellitus with hyperglycemia: Secondary | ICD-10-CM | POA: Diagnosis not present

## 2021-11-17 DIAGNOSIS — I10 Essential (primary) hypertension: Secondary | ICD-10-CM | POA: Diagnosis not present

## 2021-11-18 ENCOUNTER — Encounter: Payer: Self-pay | Admitting: *Deleted

## 2021-11-18 LAB — HEMOGLOBIN A1C
Est. average glucose Bld gHb Est-mCnc: 151 mg/dL
Hgb A1c MFr Bld: 6.9 % — ABNORMAL HIGH (ref 4.8–5.6)

## 2021-11-18 LAB — RENAL FUNCTION PANEL
Albumin: 4.3 g/dL (ref 3.9–4.9)
BUN/Creatinine Ratio: 18 (ref 12–28)
BUN: 17 mg/dL (ref 8–27)
CO2: 25 mmol/L (ref 20–29)
Calcium: 10 mg/dL (ref 8.7–10.3)
Chloride: 100 mmol/L (ref 96–106)
Creatinine, Ser: 0.95 mg/dL (ref 0.57–1.00)
Glucose: 116 mg/dL — ABNORMAL HIGH (ref 70–99)
Phosphorus: 3.4 mg/dL (ref 3.0–4.3)
Potassium: 4 mmol/L (ref 3.5–5.2)
Sodium: 142 mmol/L (ref 134–144)
eGFR: 65 mL/min/{1.73_m2} (ref >59)

## 2021-11-18 NOTE — Progress Notes (Signed)
[place] ,   Your labwork results all are within normal limits Except your A1C 6.9 vs 6.4 3 mo ago which is indicative of diabetes Lifestyle modifications encouraged as well as proper hydration. Please, let us know if you would like to start a medication for diabetes.  If you do, please, call and schedule an appt.   Best, Mardene Speak, PA-C

## 2021-11-19 ENCOUNTER — Encounter: Payer: Self-pay | Admitting: *Deleted

## 2021-11-25 DIAGNOSIS — G720 Drug-induced myopathy: Secondary | ICD-10-CM | POA: Insufficient documentation

## 2021-11-25 DIAGNOSIS — T466X5A Adverse effect of antihyperlipidemic and antiarteriosclerotic drugs, initial encounter: Secondary | ICD-10-CM | POA: Insufficient documentation

## 2021-11-25 NOTE — Progress Notes (Signed)
I,Joseline E Rosas,acting as a scribe for [employer], MD.,have documented all relevant documentation on the behalf of Eulis Foster, MD,as directed by  Eulis Foster, MD while in the presence of Eulis Foster, MD.   Established patient visit   Patient: Sheila Herrera   DOB: Jul 06, 1950   71 y.o. Female  MRN: [id number] Visit Date: 11/26/2021  Today's healthcare provider: Eulis Foster, MD   Chief Complaint  Patient presents with  . New DM    Subjective    HPI  Diabetes Mellitus Type II, Initial Visit: Patient here for an initial evaluation of Type 2 diabetes mellitus.  Current symptoms/problems include none and have been unchanged.   The patient was initially diagnosed with Type 2 diabetes mellitus based on the following criteria:  labs 11/17/2021  Known diabetic complications:  had a stent put in 2017 MI Cardiovascular risk factors: advanced age (older than 27 for men, 63 for women), hypertension, and obesity (BMI >= 30 kg/m2)  Prior visit with dietician: no Current diet: in general, an "unhealthy" diet Current exercise: none  Is She on ACE inhibitor or angiotensin II receptor blocker?  No   Medications: Outpatient Medications Prior to Visit  Medication Sig  . Alirocumab (PRALUENT) 150 MG/ML SOAJ Inject into the skin.  Marland Kitchen ALPRAZolam (XANAX) 0.25 MG tablet Take by mouth.  Marland Kitchen amLODipine (NORVASC) 5 MG tablet Take 1 tablet by mouth daily.  Marland Kitchen aspirin 81 MG chewable tablet Chew 81 mg by mouth.  . cyanocobalamin (,VITAMIN B-12,) 1000 MCG/ML injection Inject 1 mL Once Weekly Until June 2023  . ergocalciferol (VITAMIN D2) 1.25 MG (50000 UT) capsule Take 1 capsule once a week for 8 weeks.  Marland Kitchen estradiol (ESTRACE) 0.1 MG/GM vaginal cream Estrogen Cream Instruction Discard applicator Apply pea sized amount to tip of finger to urethra before bed. Wash hands well after application. Use Monday, Wednesday and Friday  .  hydrochlorothiazide (HYDRODIURIL) 25 MG tablet hydrochlorothiazide 25 mg tablet  TAKE 1 TABLET BY MOUTH EVERY DAY  . meloxicam (MOBIC) 7.5 MG tablet TAKE 1 TABLET BY MOUTH EVERY DAY  . metoprolol succinate (TOPROL-XL) 25 MG 24 hr tablet Take 12.5 mg by mouth.  . nitroGLYCERIN (NITROSTAT) 0.4 MG SL tablet   . SYNTHROID 75 MCG tablet TAKE 1 TABLET (75 MCG TOTAL) BY MOUTH DAILY BEFORE BREAKFAST.  Marland Kitchen telmisartan (MICARDIS) 20 MG tablet Take 20 mg by mouth daily.   No facility-administered medications prior to visit.    Review of Systems  {Labs  Heme  Chem  Endocrine  Serology  Results Review (optional):23779}   Objective    BP 114/77 (BP Location: Left Arm, Patient Position: Sitting, Cuff Size: Normal)   Pulse 69   Temp 98.2 F (36.8 C) (Oral)   Resp 16   Ht 5\' 4"  (1.626 m)   Wt 172 lb 9.6 oz (78.3 kg)   BMI 29.63 kg/m  {Show previous vital signs (optional):23777}  Physical Exam Vitals reviewed.  Constitutional:      General: She is not in acute distress.    Appearance: Normal appearance. She is not ill-appearing, toxic-appearing or diaphoretic.  Eyes:     Conjunctiva/sclera: Conjunctivae normal.  Cardiovascular:     Rate and Rhythm: Normal rate and regular rhythm.     Pulses: Normal pulses.     Heart sounds: Murmur heard.     No friction rub. No gallop.  Pulmonary:     Effort: Pulmonary effort is normal. No respiratory distress.  Breath sounds: Normal breath sounds. No stridor. No wheezing, rhonchi or rales.  Abdominal:     General: Bowel sounds are normal. There is no distension.     Palpations: Abdomen is soft.     Tenderness: There is no abdominal tenderness.  Musculoskeletal:     Right lower leg: No edema.     Left lower leg: No edema.  Skin:    Findings: No erythema or rash.  Neurological:     Mental Status: She is alert and oriented to person, place, and time.      No results found for any visits on 11/26/21.  Assessment & Plan     Problem List  Items Addressed This Visit       Musculoskeletal and Integument   Statin myopathy - Primary   Other Visit Diagnoses     Need for immunization against influenza            No follow-ups on file.      I, Joseline E Rosas, CMA, have reviewed all documentation for this visit. The documentation on 11/26/21 for the exam, diagnosis, procedures, and orders are all accurate and complete.    Ronnald Ramp, MD  [facility] [phone] (phone) [phone] (fax)  [facility]

## 2021-11-26 ENCOUNTER — Ambulatory Visit (INDEPENDENT_AMBULATORY_CARE_PROVIDER_SITE_OTHER): Payer: Medicare HMO | Admitting: Family Medicine

## 2021-11-26 ENCOUNTER — Encounter: Payer: Self-pay | Admitting: Family Medicine

## 2021-11-26 ENCOUNTER — Ambulatory Visit: Payer: Medicare HMO | Admitting: Family Medicine

## 2021-11-26 VITALS — BP 114/77 | HR 69 | Temp 98.2°F | Resp 16 | Ht 64.0 in | Wt 172.6 lb

## 2021-11-26 DIAGNOSIS — T466X5A Adverse effect of antihyperlipidemic and antiarteriosclerotic drugs, initial encounter: Secondary | ICD-10-CM

## 2021-11-26 DIAGNOSIS — E1165 Type 2 diabetes mellitus with hyperglycemia: Secondary | ICD-10-CM | POA: Diagnosis not present

## 2021-11-26 DIAGNOSIS — Z23 Encounter for immunization: Secondary | ICD-10-CM

## 2021-11-26 DIAGNOSIS — G720 Drug-induced myopathy: Secondary | ICD-10-CM | POA: Diagnosis not present

## 2021-11-26 MED ORDER — METFORMIN HCL 500 MG PO TABS: 500.0000 mg | ORAL_TABLET | Freq: Two times a day (BID) | ORAL | 3 refills | Status: DC

## 2021-11-26 NOTE — Patient Instructions (Addendum)
Please start metformin 500 mg once daily for 1 week.  If she can tolerate this medicine for a week, start taking metformin 500 mg twice daily.  Please plan to follow-up with me in 1 month.  Please notify our office if you have side effects notably diarrhea and cannot tolerate the metformin and we will plan to prescribe a different medication  Diabetes Mellitus and Nutrition, Adult When you have diabetes, or diabetes mellitus, it is very important to have healthy eating habits because your blood sugar (glucose) levels are greatly affected by what you eat and drink. Eating healthy foods in the right amounts, at about the same times every day, can help you: Manage your blood glucose. Lower your risk of heart disease. Improve your blood pressure. Reach or maintain a healthy weight. What can affect my meal plan? Every person with diabetes is different, and each person has different needs for a meal plan. Your health care provider may recommend that you work with a dietitian to make a meal plan that is best for you. Your meal plan may vary depending on factors such as: The calories you need. The medicines you take. Your weight. Your blood glucose, blood pressure, and cholesterol levels. Your activity level. Other health conditions you have, such as heart or kidney disease. How do carbohydrates affect me? Carbohydrates, also called carbs, affect your blood glucose level more than any other type of food. Eating carbs raises the amount of glucose in your blood. It is important to know how many carbs you can safely have in each meal. This is different for every person. Your dietitian can help you calculate how many carbs you should have at each meal and for each snack. How does alcohol affect me? Alcohol can cause a decrease in blood glucose (hypoglycemia), especially if you use insulin or take certain diabetes medicines by mouth. Hypoglycemia can be a life-threatening condition. Symptoms of  hypoglycemia, such as sleepiness, dizziness, and confusion, are similar to symptoms of having too much alcohol. Do not drink alcohol if: Your health care provider tells you not to drink. You are pregnant, may be pregnant, or are planning to become pregnant. If you drink alcohol: Limit how much you have to: 0-1 drink a day for women. 0-2 drinks a day for men. Know how much alcohol is in your drink. In the [place] one drink equals one 12 oz bottle of beer (355 mL), one 5 oz glass of wine (148 mL), or one 1 oz glass of hard liquor (44 mL). Keep yourself hydrated with water, diet soda, or unsweetened iced tea. Keep in mind that regular soda, juice, and other mixers may contain a lot of sugar and must be counted as carbs. What are tips for following this plan?  Reading food labels Start by checking the serving size on the Nutrition Facts label of packaged foods and drinks. The number of calories and the amount of carbs, fats, and other nutrients listed on the label are based on one serving of the item. Many items contain more than one serving per package. Check the total grams (g) of carbs in one serving. Check the number of grams of saturated fats and trans fats in one serving. Choose foods that have a low amount or none of these fats. Check the number of milligrams (mg) of salt (sodium) in one serving. Most people should limit total sodium intake to less than 2,300 mg per day. Always check the nutrition information of foods labeled as "low-fat" or "  nonfat." These foods may be higher in added sugar or refined carbs and should be avoided. Talk to your dietitian to identify your daily goals for nutrients listed on the label. Shopping Avoid buying canned, pre-made, or processed foods. These foods tend to be high in fat, sodium, and added sugar. Shop around the outside edge of the grocery store. This is where you will most often find fresh fruits and vegetables, bulk grains, fresh meats, and fresh dairy  products. Cooking Use low-heat cooking methods, such as baking, instead of high-heat cooking methods, such as deep frying. Cook using healthy oils, such as olive, canola, or sunflower oil. Avoid cooking with butter, cream, or high-fat meats. Meal planning Eat meals and snacks regularly, preferably at the same times every day. Avoid going long periods of time without eating. Eat foods that are high in fiber, such as fresh fruits, vegetables, beans, and whole grains. Eat 4-6 oz (112-168 g) of lean protein each day, such as lean meat, chicken, fish, eggs, or tofu. One ounce (oz) (28 g) of lean protein is equal to: 1 oz (28 g) of meat, chicken, or fish. 1 egg.  cup (62 g) of tofu. Eat some foods each day that contain healthy fats, such as avocado, nuts, seeds, and fish. What foods should I eat? Fruits Berries. Apples. Oranges. Peaches. Apricots. Plums. Grapes. Mangoes. Papayas. Pomegranates. Kiwi. Cherries. Vegetables Leafy greens, including lettuce, spinach, kale, chard, collard greens, mustard greens, and cabbage. Beets. Cauliflower. Broccoli. Carrots. Green beans. Tomatoes. Peppers. Onions. Cucumbers. Brussels sprouts. Grains Whole grains, such as whole-wheat or whole-grain bread, crackers, tortillas, cereal, and pasta. Unsweetened oatmeal. Quinoa. Brown or wild rice. Meats and other proteins Seafood. Poultry without skin. Lean cuts of poultry and beef. Tofu. Nuts. Seeds. Dairy Low-fat or fat-free dairy products such as milk, yogurt, and cheese. The items listed above may not be a complete list of foods and beverages you can eat and drink. Contact a dietitian for more information. What foods should I avoid? Fruits Fruits canned with syrup. Vegetables Canned vegetables. Frozen vegetables with butter or cream sauce. Grains Refined white flour and flour products such as bread, pasta, snack foods, and cereals. Avoid all processed foods. Meats and other proteins Fatty cuts of meat.  Poultry with skin. Breaded or fried meats. Processed meat. Avoid saturated fats. Dairy Full-fat yogurt, cheese, or milk. Beverages Sweetened drinks, such as soda or iced tea. The items listed above may not be a complete list of foods and beverages you should avoid. Contact a dietitian for more information. Questions to ask a health care provider Do I need to meet with a certified diabetes care and education specialist? Do I need to meet with a dietitian? What number can I call if I have questions? When are the best times to check my blood glucose? Where to find more information: American Diabetes Association: diabetes.org Academy of Nutrition and Dietetics: eatright.[employer] of Diabetes and Digestive and Kidney Diseases: [employer] Association of Diabetes Care & Education Specialists: diabeteseducator.org Summary It is important to have healthy eating habits because your blood sugar (glucose) levels are greatly affected by what you eat and drink. It is important to use alcohol carefully. A healthy meal plan will help you manage your blood glucose and lower your risk of heart disease. Your health care provider may recommend that you work with a dietitian to make a meal plan that is best for you. This information is not intended to replace advice given to you by your health  care provider. Make sure you discuss any questions you have with your health care provider. Document Revised: 08/29/2019 Document Reviewed: 08/29/2019 Elsevier Patient Education  [place].

## 2021-11-28 DIAGNOSIS — Z23 Encounter for immunization: Secondary | ICD-10-CM | POA: Insufficient documentation

## 2021-11-28 DIAGNOSIS — E1165 Type 2 diabetes mellitus with hyperglycemia: Secondary | ICD-10-CM | POA: Insufficient documentation

## 2021-11-28 NOTE — Assessment & Plan Note (Signed)
Influenza vaccine administered today.

## 2021-11-28 NOTE — Assessment & Plan Note (Signed)
Unable to tolerate statin therapy guidelines due to myopathy

## 2021-11-28 NOTE — Assessment & Plan Note (Signed)
Newly diagnosed  Will start metformin 500mg  daily and increase to BID if tolerated well  Discussed dietary modifications to help with BG management See AVS for dietary suggestions  She will follow up in 1 month  Plan for repat A1c from last measurement

## 2021-12-03 ENCOUNTER — Ambulatory Visit: Payer: Self-pay

## 2021-12-03 NOTE — Telephone Encounter (Signed)
Please contact patient and advise her to take once daily.   Eulis Foster, MD  [facility]  [phone]

## 2021-12-03 NOTE — Telephone Encounter (Signed)
Message from [place] sent at 12/03/2021 10:38 AM EDT  Summary: medication question/concern   Pt reports that she has not had a bowel movement since she increased the metFORMIN (GLUCOPHAGE) 500 MG tablet to twice daily as directed. Pt requests call back to discuss possibly only taking it once daily. Cb# [phone]          Chief Complaint: Constipation since Tuesday- thinks could be related to increased dose of Metformin- Usually has BM 1 or 2 times daily.  Symptoms: feels uncomfortable no pain  Frequency: no BM since Tuesday Pertinent Negatives: Patient denies pain, or bloating, vomiting or abd pain Disposition: [] ED /[] Urgent Care (no appt availability in office) / [] Appointment(In office/virtual)/ []  [facility]/ [] Home Care/ [] Refused Recommended Disposition /[] [facility]/ [x]  Follow-up with PCP Additional Notes: please call pt with advice regarding Metformin   Reason for Disposition  Taking new prescription medication  Answer Assessment - Initial Assessment Questions 1. STOOL PATTERN OR FREQUENCY: "How often do you have a bowel movement (BM)?"  (Normal range: 3 times a day to every 3 days)  "When was your last BM?"       Daily none in 3 days 2. STRAINING: "Do you have to strain to have a BM?"      No urge 3. RECTAL PAIN: "Does your rectum hurt when the stool comes out?" If Yes, ask: "Do you have hemorrhoids? How bad is the pain?"  (Scale 1-10; or mild, moderate, severe)     No stool yet 4. STOOL COMPOSITION: "Are the stools hard?"      N/a 5. BLOOD ON STOOLS: "Has there been any blood on the toilet tissue or on the surface of the BM?" If Yes, ask: "When was the last time?"     N/a 6. CHRONIC CONSTIPATION: "Is this a new problem for you?"  If No, ask: "How long have you had this problem?" (days, weeks, months)      Yes started Tuesday 7. CHANGES IN DIET OR HYDRATION: "Have there been any recent changes in your diet?" "How much fluids are you  drinking on a daily basis?"  "How much have you had to drink today?"     No- drinking extra fluids 8. MEDICINES: "Have you been taking any new medicines?" "Are you taking any narcotic pain medicines?" (e.g., Dilaudid, morphine, Percocet, Vicodin)     Increased Metformin to 2 daily  9. LAXATIVES: "Have you been using any stool softeners, laxatives, or enemas?"  If Yes, ask "What, how often, and when was the last time?"     no 10. ACTIVITY:  "How much walking do you do every day?"  "Has your activity level decreased in the past week?"        yes 11. CAUSE: "What do you think is causing the constipation?"        metformin 12. OTHER SYMPTOMS: "Do you have any other symptoms?" (e.g., abdomen pain, bloating, fever, vomiting)       no 13. MEDICAL HISTORY: "Do you have a history of hemorrhoids, rectal fissures, or rectal surgery or rectal abscess?"         no 14. PREGNANCY: "Is there any chance you are pregnant?" "When was your last menstrual period?"       N/a  Protocols used: Constipation-A-AH

## 2021-12-03 NOTE — Telephone Encounter (Signed)
Patient advised as below.  

## 2021-12-09 ENCOUNTER — Ambulatory Visit: Payer: Medicare HMO

## 2021-12-09 ENCOUNTER — Ambulatory Visit: Admit: 2021-12-09 | Discharge: 2021-12-10 | Payer: MEDICARE

## 2021-12-09 DIAGNOSIS — I259 Chronic ischemic heart disease, unspecified: Principal | ICD-10-CM

## 2021-12-09 DIAGNOSIS — I10 Essential (primary) hypertension: Principal | ICD-10-CM

## 2021-12-09 DIAGNOSIS — I240 Acute coronary thrombosis not resulting in myocardial infarction: Principal | ICD-10-CM

## 2021-12-09 DIAGNOSIS — E785 Hyperlipidemia, unspecified: Principal | ICD-10-CM

## 2021-12-11 ENCOUNTER — Ambulatory Visit: Payer: Self-pay | Admitting: *Deleted

## 2021-12-11 NOTE — Patient Instructions (Signed)
Visit Information  Thank you for taking time to visit with me today. Please don't hesitate to contact me if I can be of assistance to you before our next scheduled telephone appointment.  Following are the goals we discussed today:  Continue increasing exercise and activity. Monitor foods you eat, less carbohydrates and sugars, more fruits/vegetables.  Our next appointment is by telephone on 12/5  Please call the care guide team at [phone] if you need to cancel or reschedule your appointment.   Please call the Suicide and Crisis Lifeline: 988 call the [place] National Suicide Prevention Lifeline: 5098602888 or TTY: 512-430-2277 TTY 319-528-6430) to talk to a trained counselor call 1-800-273-TALK (toll free, 24 hour hotline) call 911 if you are experiencing a Mental Health or [place] or need someone to talk to.  Patient verbalizes understanding of instructions and care plan provided today and agrees to view in [place]. Active MyChart status and patient understanding of how to access instructions and care plan via MyChart confirmed with patient.     The patient has been provided with contact information for the care management team and has been advised to call with any health related questions or concerns.    Valente David, RN, MSN, [place] Care Management Care Management Coordinator [phone]

## 2021-12-11 NOTE — Patient Outreach (Signed)
Care Coordination   Follow Up Visit Note   12/11/2021 Name: Sheila Herrera MRN: [id number] DOB: 1950-09-04  Sheila Herrera is a 71 y.o. year old female who sees Simmons-Robinson, Riki Sheer, MD for primary care. I spoke with  Percell Locus by phone today.  What matters to the patients health and wellness today?  Ongoing management of DM.      Goals Addressed             This Visit's Progress    COMPLETED: RNCM: "Having a hard time getting a deep breath"   On track    Care Coordination Interventions: Evaluation of current treatment plan related to patient feeling that she cannot get a deep breath and patient's adherence to plan as established by provider Advised patient to go to the urgent care to be seen. The patient has called the office and the patient cannot be seen in the office today. Education on urgent care, [facility], or online availability with a provider  Provided education to patient re: mindfulness, breathing exercises. Will provide breathing exercises on myChart information Reviewed medications with patient and discussed compliance. The patient does not use any inhalers, review of all medications and education provided  Provided patient with purse lip breathing educational materials related to the patient feeling she cannot get a good breath Reviewed scheduled/upcoming provider appointments including 11-26-2021 with pcp. Advised the patient to go to urgent care for immediate needs that are non-emergent. The patient does not want to go to the ER due to increase risk of COVID Discussed plans with patient for ongoing care management follow up and provided patient with direct contact information for care management team Advised patient to discuss questions and concerns  with provider Active listening / Reflection utilized  Emotional Support Provided   11/3 - Denies any further difficulty breathing, report it was anxiety     RNCM: Discuss with MD about DM    On track    Care Coordination Interventions:  Lab Results  Component Value Date   HGBA1C 6.9 (H) 11/17/2021    Provided education to patient about basic DM disease process. Review and education provided. The patient will see the pcp in October and will discuss labwork and DM status. Encouraged the patient to write down questions to ask the pcp at upcoming visit  Counseled on importance of regular laboratory monitoring as prescribed. Patient states she will have new labwork at next visit. Review of goal of A1c and blood sugar ranges Discussed plans with patient for ongoing care management follow up and provided patient with direct contact information for care management team Provided patient with written educational materials related to hypo and hyperglycemia and importance of correct treatment. Review of sx and sx of hypo and hyperglycemia. Email sent with information on hypot and hyperglycemia also Reviewed scheduled/upcoming provider appointments including: 11/8 for B12 shot and 11/20 with pcp Advised patient, providing education and rationale, to check cbg as directed  and record, calling pcp for findings outside established parameters. The patient currently does not have a meter and has not been taking her blood sugars. Will discuss use of meter if A1C remains elevated  Review decrease in Metformin dose due to abdominal discomfort and constipation, no constipation since change Was seen by cardiology office on Wednesday, they have place referral for dietician.  She is also increasing activity, walking on treadmill Screening for signs and symptoms of depression related to chronic disease state Review of the Care coordination program and  the role of the RNCM. The patient agrees to work with the [facility] and agrees to [employer]. She has contact information for the [facility] and will call between outreaches if new needs or concerns arise.  Next AWV scheduled for 05-25-2022           SDOH assessments  and interventions completed:  No     Care Coordination Interventions Activated:  Yes  Care Coordination Interventions:  Yes, provided   Follow up plan: Follow up call scheduled for 12/5    Encounter Outcome:  Pt. Visit Completed   Kemper Durie, RN, MSN, [facility] Care Management Care Management Coordinator [phone]

## 2021-12-12 DIAGNOSIS — I259 Chronic ischemic heart disease, unspecified: Principal | ICD-10-CM

## 2021-12-12 DIAGNOSIS — I240 Acute coronary thrombosis not resulting in myocardial infarction: Principal | ICD-10-CM

## 2021-12-12 MED ORDER — PRALUENT PEN 150 MG/ML SUBCUTANEOUS PEN INJECTOR
SUBCUTANEOUS | 3 refills | 0 days
Start: 2021-12-12 — End: ?

## 2021-12-14 ENCOUNTER — Encounter: Payer: Medicare HMO | Admitting: Family Medicine

## 2021-12-14 MED ORDER — PRALUENT PEN 150 MG/ML SUBCUTANEOUS PEN INJECTOR
SUBCUTANEOUS | 3 refills | 84 days | Status: CP
Start: 2021-12-14 — End: ?

## 2021-12-16 ENCOUNTER — Ambulatory Visit (INDEPENDENT_AMBULATORY_CARE_PROVIDER_SITE_OTHER): Payer: Medicare HMO | Admitting: Physician Assistant

## 2021-12-16 DIAGNOSIS — E538 Deficiency of other specified B group vitamins: Secondary | ICD-10-CM

## 2021-12-16 MED ORDER — CYANOCOBALAMIN 1000 MCG/ML IJ SOLN
1000.0000 ug | Freq: Once | INTRAMUSCULAR | Status: AC
  Administered 2021-12-16: 1000 ug via INTRAMUSCULAR

## 2021-12-28 ENCOUNTER — Ambulatory Visit (INDEPENDENT_AMBULATORY_CARE_PROVIDER_SITE_OTHER): Payer: Medicare HMO | Admitting: Family Medicine

## 2021-12-28 ENCOUNTER — Ambulatory Visit: Payer: Self-pay

## 2021-12-28 ENCOUNTER — Encounter: Payer: Self-pay | Admitting: Family Medicine

## 2021-12-28 VITALS — BP 144/86 | HR 68 | Temp 97.9°F | Resp 16 | Ht 64.0 in | Wt 163.0 lb

## 2021-12-28 DIAGNOSIS — E1165 Type 2 diabetes mellitus with hyperglycemia: Secondary | ICD-10-CM | POA: Diagnosis not present

## 2021-12-28 LAB — POCT GLYCOSYLATED HEMOGLOBIN (HGB A1C)
Est. average glucose Bld gHb Est-mCnc: 134
Hemoglobin A1C: 6.3 % — AB (ref 4.0–5.6)

## 2021-12-28 MED ORDER — OZEMPIC (0.25 OR 0.5 MG/DOSE) 2 MG/3ML ~~LOC~~ SOPN: 0.2500 mg | PEN_INJECTOR | SUBCUTANEOUS | 0 refills | Status: DC

## 2021-12-28 MED ORDER — METFORMIN HCL 500 MG PO TABS: 500.0000 mg | ORAL_TABLET | Freq: Every day | ORAL | 3 refills | Status: DC

## 2021-12-28 NOTE — Progress Notes (Signed)
I,Tiffany J Bragg,acting as a scribe for [employer], MD.,have documented all relevant documentation on the behalf of Eulis Foster, MD,as directed by  Eulis Foster, MD while in the presence of Eulis Foster, MD.   Established patient visit   Patient: Sheila Herrera   DOB: 10-01-1950   71 y.o. Female  MRN: [id number] Visit Date: 12/28/2021  Today's healthcare provider: Eulis Foster, MD   Chief Complaint  Patient presents with   Diabetes   Subjective    HPI  Diabetes Mellitus Type II, Follow-up  Lab Results  Component Value Date   HGBA1C 6.3 (A) 12/28/2021   HGBA1C 6.9 (H) 11/17/2021   HGBA1C 6.4 (H) 08/04/2021   Wt Readings from Last 3 Encounters:  12/28/21 163 lb (73.9 kg)  11/26/21 172 lb 9.6 oz (78.3 kg)  10/26/21 171 lb (77.6 kg)   Last seen for diabetes 6 weeks ago.  Management since then includes started 574m metformin. She reports excellent compliance with treatment. She is having side effects. Feeling tired and constipation  Symptoms: Yes fatigue No foot ulcerations  No appetite changes No nausea  Yes paresthesia of the feet  No polydipsia  No polyuria No visual disturbances   No vomiting     Home blood sugar records:  not checked  Episodes of hypoglycemia? No    Current insulin regimen:  Most Recent Eye Exam: is due in january Current exercise: walking Current diet habits: well balanced  Pertinent Labs: No results found for: "CHOL", "HDL", "LDLCALC", "LDLDIRECT", "TRIG", "CHOLHDL" Lab Results  Component Value Date   NA 142 11/17/2021   K 4.0 11/17/2021   CREATININE 0.95 11/17/2021   EGFR 65 11/17/2021   LABMICR See below: 03/11/2021     ---------------------------------------------------------------------------------------------------   Medications: Outpatient Medications Prior to Visit  Medication Sig   Alirocumab (PRALUENT) 150 MG/ML SOAJ Inject into the skin.    ALPRAZolam (XANAX) 0.25 MG tablet Take by mouth.   amLODipine (NORVASC) 5 MG tablet Take 1 tablet by mouth daily.   aspirin 81 MG chewable tablet Chew 81 mg by mouth.   cyanocobalamin (,VITAMIN B-12,) 1000 MCG/ML injection Inject 1 mL Once Weekly Until June 2023   ergocalciferol (VITAMIN D2) 1.25 MG (50000 UT) capsule Take 1 capsule once a week for 8 weeks.   estradiol (ESTRACE) 0.1 MG/GM vaginal cream Estrogen Cream Instruction Discard applicator Apply pea sized amount to tip of finger to urethra before bed. Wash hands well after application. Use Monday, Wednesday and Friday   hydrochlorothiazide (HYDRODIURIL) 25 MG tablet hydrochlorothiazide 25 mg tablet  TAKE 1 TABLET BY MOUTH EVERY DAY   meloxicam (MOBIC) 7.5 MG tablet TAKE 1 TABLET BY MOUTH EVERY DAY   metoprolol succinate (TOPROL-XL) 25 MG 24 hr tablet Take 12.5 mg by mouth.   nitroGLYCERIN (NITROSTAT) 0.4 MG SL tablet    SYNTHROID 75 MCG tablet TAKE 1 TABLET (75 MCG TOTAL) BY MOUTH DAILY BEFORE BREAKFAST.   telmisartan (MICARDIS) 20 MG tablet Take 20 mg by mouth daily.   [DISCONTINUED] metFORMIN (GLUCOPHAGE) 500 MG tablet Take 1 tablet (500 mg total) by mouth 2 (two) times daily with a meal.   No facility-administered medications prior to visit.    Review of Systems     Objective    BP (!) 144/86 (BP Location: Right Arm, Patient Position: Sitting, Cuff Size: Normal)   Pulse 68   Temp 97.9 F (36.6 C) (Oral)   Resp 16   Ht _0  (1.626 m)   Wt  163 lb (73.9 kg)   SpO2 99%   BMI 27.98 kg/m    Physical Exam Vitals reviewed.  Constitutional:      General: She is not in acute distress.    Appearance: Normal appearance. She is not ill-appearing, toxic-appearing or diaphoretic.  Eyes:     General: No scleral icterus.    Conjunctiva/sclera: Conjunctivae normal.  Cardiovascular:     Rate and Rhythm: Normal rate and regular rhythm.     Pulses: Normal pulses.     Heart sounds:     No friction rub. No gallop.  Pulmonary:      Effort: Pulmonary effort is normal. No respiratory distress.     Breath sounds: Normal breath sounds. No stridor. No wheezing, rhonchi or rales.  Abdominal:     General: Bowel sounds are normal. There is no distension.     Palpations: Abdomen is soft.     Tenderness: There is no abdominal tenderness.  Musculoskeletal:     Right lower leg: No edema.     Left lower leg: No edema.  Skin:    Findings: No erythema or rash.  Neurological:     Mental Status: She is alert and oriented to person, place, and time.  Psychiatric:        Mood and Affect: Mood is anxious. Affect is tearful.        Speech: Speech normal.        Behavior: Behavior normal. Behavior is cooperative.        Thought Content: Thought content normal.       Results for orders placed or performed in visit on 12/28/21  POCT glycosylated hemoglobin (Hb A1C)  Result Value Ref Range   Hemoglobin A1C 6.3 (A) 4.0 - 5.6 %   Est. average glucose Bld gHb Est-mCnc 134     Assessment & Plan     Problem List Items Addressed This Visit       Endocrine   Type 2 diabetes mellitus with hyperglycemia, without long-term current use of insulin (HCC) - Primary    A1c repeated and down from 6.9 to 6.3  Well controlled  She will continue metformin 522m daily as she could not tolerate 10078mdaily due to reported constipation  She would like to add an agent to help with A1c lowering as well as weight management  Will RX printed script for semaglutide starting dose 0.2582mnce weekly for four weeks  Follow up in 1 month  Congratulated patient on improved A1c, 9 lb weight loss      Relevant Medications   metFORMIN (GLUCOPHAGE) 500 MG tablet   Semaglutide,0.25 or 0.5MG/DOS, (OZEMPIC, 0.25 OR 0.5 MG/DOSE,) 2 MG/3ML SOPN   Other Relevant Orders   POCT glycosylated hemoglobin (Hb A1C) (Completed)     Return in about 1 month (around 01/27/2022) for ozempic f/u .      I, MakEulis FosterD, have reviewed all  documentation for this visit.  Portions of this information were initially documented by the CMA and reviewed by me for thoroughness and accuracy.      MakEulis FosterD  [phone]) [phone])  [place]

## 2021-12-28 NOTE — Assessment & Plan Note (Signed)
A1c repeated and down from 6.9 to 6.3  Well controlled  She will continue metformin 500mg  daily as she could not tolerate 1000mg  daily due to reported constipation  She would like to add an agent to help with A1c lowering as well as weight management  Will RX printed script for semaglutide starting dose 0.25mg  once weekly for four weeks  Follow up in 1 month  Congratulated patient on improved A1c, 9 lb weight loss

## 2021-12-28 NOTE — Telephone Encounter (Signed)
  Chief Complaint: medication assistance  Symptoms: NA Frequency: today Pertinent Negatives: NA Disposition: [] ED /[] Urgent Care (no appt availability in office) / [] Appointment(In office/virtual)/ []  [place] [facility]/ [x] Home Care/ [] Refused Recommended Disposition /[] [place] Mobile [facility]/ []  Follow-up with PCP Additional Notes: pt states she hasn't went and pu medication yet but wanting to know if she can start tonight or wait til tomorrow or did she need to wait until next week. I advised pt she can start tonight or wait until tomorrow morning was fine as well. Pt verbalized understanding and didn't have further questions.   Summary: Question about Ozempic   The patient called in wanting to talk to her provider about the medication Semaglutide,0.25 or 0.5MG /DOS, (OZEMPIC, 0.25 OR 0.5 MG/DOSE,) 2 prescribed. She wants to know if she is supposed to star taking it tomorrow or on Monday starting a new week. Please assist patient further.     Reason for Disposition . Caller has medicine question only, adult not sick, AND triager answers question  Answer Assessment - Initial Assessment Questions 1. NAME of MEDICINE: "What medicine(s) are you calling about?"     Ozempic 2. QUESTION: "What is your question?" (e.g., double dose of medicine, side effect)     Wanting to know if start today or tomorrow or wait til next week 3. PRESCRIBER: "Who prescribed the medicine?" Reason: if prescribed by specialist, call should be referred to that group.     Dr. .  4. SYMPTOMS: "Do you have any symptoms?" If Yes, ask: "What symptoms are you having?"  "How bad are the symptoms (e.g., mild, moderate, severe)     NA  Protocols used: Medication Question Call-A-AH

## 2022-01-12 ENCOUNTER — Ambulatory Visit: Payer: Self-pay | Admitting: *Deleted

## 2022-01-12 NOTE — Patient Outreach (Signed)
  Care Coordination   Follow Up Visit Note   01/12/2022 Name: Sheila Herrera MRN: [id number] DOB: [birth year]  Sheila Herrera is a 71 y.o. year old female who sees Simmons-Robinson, Tawanna Cooler, MD for primary care. I spoke with  [name] by phone today.  What matters to the patients health and wellness today?  Keeping DM well controlled and losing weight while on Oxempic.    Goals Addressed             This Visit's Progress    RNCM: Discuss with MD about DM   On track    Care Coordination Interventions:  Lab Results  Component Value Date   HGBA1C 6.3 (A) 12/28/2021    Provided education to patient about basic DM disease process. Review and education provided.  Counseled on importance of regular laboratory monitoring as prescribed. Patient states she will have new labwork at next visit. Review of goal of A1c and blood sugar ranges, aware of new result Discussed plans with patient for ongoing care management follow up and provided patient with direct contact information for care management team Provided patient with written educational materials related to hypo and hyperglycemia and importance of correct treatment. Review of sx and sx of hypo and hyperglycemia. Email sent with information on hypot and hyperglycemia also Reviewed scheduled/upcoming provider appointments including: 12/6 for B12 shot and 12/20 with pcp Review decrease in Metformin dose due to abdominal discomfort and constipation, no constipation since change.  Also now on Ozempic for A1C and weight control Discussed cost of Ozempic, will place pharmacy referral for medication assistance Review of the Care coordination program and the role of the RNCM. The patient agrees to work with the [facility] and agrees to [employer]. She has contact information for the [facility] and will call between outreaches if new needs or concerns arise.  Next AWV scheduled for 05-25-2022           SDOH assessments and interventions  completed:  No     Care Coordination Interventions:  Yes, provided   Follow up plan: Follow up call scheduled for 1/2    Encounter Outcome:  Pt. Visit Completed   Kemper Durie, RN, MSN, [facility] Care Management Care Management Coordinator [phone]

## 2022-01-12 NOTE — Patient Instructions (Signed)
Visit Information  Thank you for taking time to visit with me today. Please don't hesitate to contact me if I can be of assistance to you before our next scheduled telephone appointment.  Following are the goals we discussed today:  Listen for call from pharmacy team regarding Ozempic  Our next appointment is by telephone on 1/2  Please call the care guide team at [phone] if you need to cancel or reschedule your appointment.   Please call the Suicide and Crisis Lifeline: 988 call the [place] National Suicide Prevention Lifeline: 509-697-8950 or TTY: (682) 312-8785 TTY 6173605763) to talk to a trained counselor call 1-800-273-TALK (toll free, 24 hour hotline) call 911 if you are experiencing a Mental Health or Behavioral Health Crisis or need someone to talk to.  Patient verbalizes understanding of instructions and care plan provided today and agrees to view in MyChart. Active MyChart status and patient understanding of how to access instructions and care plan via MyChart confirmed with patient.     The patient has been provided with contact information for the care management team and has been advised to call with any health related questions or concerns.   Kemper Durie, RN, MSN, [facility] Care Management Care Management Coordinator [phone]

## 2022-01-13 ENCOUNTER — Telehealth: Payer: Self-pay | Admitting: Pharmacist

## 2022-01-13 ENCOUNTER — Ambulatory Visit: Payer: Medicare HMO

## 2022-01-13 ENCOUNTER — Encounter: Payer: Self-pay | Admitting: Family Medicine

## 2022-01-13 ENCOUNTER — Ambulatory Visit: Payer: Self-pay

## 2022-01-13 ENCOUNTER — Ambulatory Visit (INDEPENDENT_AMBULATORY_CARE_PROVIDER_SITE_OTHER): Payer: Medicare HMO | Admitting: Family Medicine

## 2022-01-13 VITALS — BP 149/76 | HR 74 | Ht 64.0 in | Wt 161.0 lb

## 2022-01-13 DIAGNOSIS — L03031 Cellulitis of right toe: Secondary | ICD-10-CM | POA: Diagnosis not present

## 2022-01-13 MED ORDER — CEPHALEXIN 500 MG PO CAPS: 500.0000 mg | ORAL_CAPSULE | Freq: Four times a day (QID) | ORAL | 0 refills | Status: AC

## 2022-01-13 NOTE — Telephone Encounter (Signed)
      Chief Complaint: Right Great toe red, swollen, painful Symptoms: Above Frequency: 2 days ago Pertinent Negatives: Patient denies fever Disposition: [] ED /[] Urgent Care (no appt availability in office) / [x] Appointment(In office/virtual)/ []  [place] Virtual [facility]/ [] Home Care/ [] Refused Recommended Disposition /[] [place] Mobile Bus/ []  Follow-up with PCP Additional Notes: OK to have VIT B 12 injection at visit per [facility].  Reason for Disposition  [1] Swollen toe AND [2] no fever  (Exceptions: Just a localized bump from bunion, corns, insect bite, sting.)  Answer Assessment - Initial Assessment Questions 1. ONSET: "When did the pain start?"      2 days ago 2. LOCATION: "Where is the pain located?"   (e.g., around nail, entire toe, at foot joint)      Right Great toe 3. PAIN: "How bad is the pain?"    (Scale 1-10; or mild, moderate, severe)   -  MILD (1-3): doesn't interfere with normal activities    -  MODERATE (4-7): interferes with normal activities (e.g., work or school) or awakens from sleep, limping    -  SEVERE (8-10): excruciating pain, unable to do any normal activities, unable to walk     Moderate 4. APPEARANCE: "What does the toe look like?" (e.g., redness, swelling, bruising, pallor)     Swelling, red 5. CAUSE: "What do you think is causing the toe pain?"     Unsure 6. OTHER SYMPTOMS: "Do you have any other symptoms?" (e.g., leg pain, rash, fever, numbness)     No 7. PREGNANCY: "Is there any chance you are pregnant?" "When was your last menstrual period?"     No  Protocols used: Toe Pain-A-AH

## 2022-01-13 NOTE — Progress Notes (Signed)
   SUBJECTIVE:   CHIEF COMPLAINT / HPI:   TOE PAIN - thinks had ingrown toenail few days before that progressed Duration: 2 days Involved foot: right great toe Mechanism of injury:  none Location: R great toe Onset: gradual  Quality:  sore Frequency: intermittent Radiation: no Aggravating factors: weight bearing and walking  Alleviating factors: sitting, tylenol  Status: stable Treatments attempted: tylenol  Relief with NSAIDs?:  No NSAIDs Taken Morning stiffness: no Swelling: yes Redness: yes Bruising: no Paresthesias / decreased sensation: no  Fevers:no   OBJECTIVE:   BP (!) 149/76 (BP Location: Right Arm, Patient Position: Sitting, Cuff Size: Normal)   Pulse 74   Ht 5\' 4"  (1.626 m)   Wt 161 lb (73 kg)   SpO2 100%   BMI 27.64 kg/m   Gen: well appearing, in NAD Toe: WWP. Erythema and swelling over 1st MTP with slight fluctuance and TTP. Intact DP pulses b/l. No pain with PROM of MTP or pressure at nail base.   ASSESSMENT/PLAN:   Cellulitis Likely precipitated by infected ingrown toenail, nail base currently well appearing but will erythema, swelling, slight fluctuance at MTP, will provide keflex x5 days. F/u prn.    , DO

## 2022-01-13 NOTE — Progress Notes (Signed)
[facility] Customer service manager [facility])                                            [facility] Quality Pharmacy Team    01/13/2022  Sheila Herrera November 30, 1950 [id number]  Initial outreach made to Sheila Herrera, HIPAA identifiers confirmed. Patient first stated it was a good time to talk, then after learning the nature of the discussion requested a call back.   Will follow up with patient in 1 to 3 business days.   Reynold Bowen, PharmD [facility]  [facility] Clinical Pharmacist Office: [phone]

## 2022-01-14 ENCOUNTER — Telehealth: Payer: Self-pay | Admitting: Pharmacist

## 2022-01-14 NOTE — Progress Notes (Signed)
[place] [facility])  [place] Team    01/14/2022  Sheila Herrera January 30, 1951 [id number]  Reason for referral: Medication Assistance with Ozempic  Referral source: [facility] RN Current insurance: [employer]  Outreach:  Successful telephone call with Sheila Herrera.  HIPAA identifiers verified.    Medication Assistance Findings:  Medication assistance needs identified: Ozempic   Extra Help:  Not eligible for Extra Help Low Income Subsidy based on reported income and assets  Patient Assistance Programs: Ozempic made by [employer] requirement met: Yes Out-of-pocket prescription expenditure met:   Not Applicable Patient has met application requirements to apply for this program.     Plan: I will route patient assistance letter to [place] technician who will coordinate patient assistance program application process for medications listed above.  [facility] pharmacy technician will assist with obtaining all required documents from both patient and provider(s) and submit application(s) once completed.

## 2022-01-15 ENCOUNTER — Ambulatory Visit
Admission: RE | Admit: 2022-01-15 | Discharge: 2022-01-15 | Disposition: A | Discharge status: Home / Self Care | Payer: Medicare HMO | Attending: Physician Assistant | Admitting: Physician Assistant

## 2022-01-15 ENCOUNTER — Ambulatory Visit (INDEPENDENT_AMBULATORY_CARE_PROVIDER_SITE_OTHER): Payer: Medicare HMO | Admitting: Physician Assistant

## 2022-01-15 ENCOUNTER — Encounter: Payer: Self-pay | Admitting: Physician Assistant

## 2022-01-15 ENCOUNTER — Ambulatory Visit: Payer: Self-pay | Admitting: *Deleted

## 2022-01-15 ENCOUNTER — Ambulatory Visit
Admission: RE | Admit: 2022-01-15 | Discharge: 2022-01-15 | Disposition: A | Discharge status: Home / Self Care | Payer: Medicare HMO | Source: Ambulatory Visit | Attending: Physician Assistant | Admitting: Physician Assistant

## 2022-01-15 VITALS — BP 154/81 | HR 75 | Resp 16 | Ht 64.0 in | Wt 160.0 lb

## 2022-01-15 DIAGNOSIS — M79674 Pain in right toe(s): Secondary | ICD-10-CM

## 2022-01-15 DIAGNOSIS — M7731 Calcaneal spur, right foot: Secondary | ICD-10-CM | POA: Diagnosis not present

## 2022-01-15 DIAGNOSIS — M2011 Hallux valgus (acquired), right foot: Secondary | ICD-10-CM | POA: Diagnosis not present

## 2022-01-15 DIAGNOSIS — M19071 Primary osteoarthritis, right ankle and foot: Secondary | ICD-10-CM | POA: Diagnosis not present

## 2022-01-15 NOTE — Telephone Encounter (Addendum)
  Chief Complaint: Right big toe remains red, swollen and painful even on the antibiotic.   Seen 01/13/2022 by Dr. Linwood Dibbles told she had an infected toenail.   She is requesting a different provider just for another opinion.  She has diabetes so is concerned about her foot and not wanting to wait since it is not getting better. Symptoms: All toes on right foot are swollen.   Only big toe is red and painful as well as swollen.   Has taken her 3rd dose of antibiotic. Frequency: No better than when she came in to be seen Pertinent Negatives: Patient denies history of gout but wonders if that is what's wrong. Disposition: [] ED /[] Urgent Care (no appt availability in office) / [x] Appointment(In office/virtual)/ []  [place] Virtual Care/ [] Home Care/ [] Refused Recommended Disposition /[] [place] Mobile Bus/ []  Follow-up with PCP Additional Notes: Appt made for today with , PA-C for today at 1:20.

## 2022-01-15 NOTE — Progress Notes (Signed)
Established patient visit   Patient: Sheila Herrera   DOB: 30-May-1950   71 y.o. Female  MRN: [id number] Visit Date: 01/15/2022  Today's healthcare provider: Alfredia Ferguson, PA-C   I,Tiffany J Bragg,acting as a scribe for Alfredia Ferguson, PA-C.,have documented all relevant documentation on the behalf of Alfredia Ferguson, PA-C,as directed by  Alfredia Ferguson, PA-C while in the presence of Alfredia Ferguson, PA-C.   Chief Complaint  Patient presents with   Toe Pain    Patient complains of continued, worsening toe pain that she does not feel is an ingrown toenail.    Subjective    HPI HPI     Toe Pain    Additional comments: Patient complains of continued, worsening toe pain that she does not feel is an ingrown toenail.       Last edited by Marlana Salvage, CMA on 01/15/2022  1:21 PM.      She denies toenail pain, reports pain is mostly present along the base of her right big toe. Reports some bruising, swelling. Denies injury. Denies history of gout. Minor improvement with antibiotic. --------------------------------------------------------------------------------------------------- Medications: Outpatient Medications Prior to Visit  Medication Sig   Alirocumab (PRALUENT) 150 MG/ML SOAJ Inject into the skin.   ALPRAZolam (XANAX) 0.25 MG tablet Take by mouth.   amLODipine (NORVASC) 5 MG tablet Take 1 tablet by mouth daily.   aspirin 81 MG chewable tablet Chew 81 mg by mouth.   cephALEXin (KEFLEX) 500 MG capsule Take 1 capsule (500 mg total) by mouth 4 (four) times daily for 5 days.   cyanocobalamin (,VITAMIN B-12,) 1000 MCG/ML injection Inject 1 mL Once Weekly Until June 2023   ergocalciferol (VITAMIN D2) 1.25 MG (50000 UT) capsule Take 1 capsule once a week for 8 weeks.   estradiol (ESTRACE) 0.1 MG/GM vaginal cream Estrogen Cream Instruction Discard applicator Apply pea sized amount to tip of finger to urethra before bed. Wash hands well after application. Use Monday,  Wednesday and Friday   hydrochlorothiazide (HYDRODIURIL) 25 MG tablet hydrochlorothiazide 25 mg tablet  TAKE 1 TABLET BY MOUTH EVERY DAY   meloxicam (MOBIC) 7.5 MG tablet TAKE 1 TABLET BY MOUTH EVERY DAY   metFORMIN (GLUCOPHAGE) 500 MG tablet Take 1 tablet (500 mg total) by mouth daily with breakfast.   metoprolol succinate (TOPROL-XL) 25 MG 24 hr tablet Take 12.5 mg by mouth.   nitroGLYCERIN (NITROSTAT) 0.4 MG SL tablet    Semaglutide,0.25 or 0.5MG /DOS, (OZEMPIC, 0.25 OR 0.5 MG/DOSE,) 2 MG/3ML SOPN Inject 0.25 mg into the skin once a week. Injection 0.25mg  once a week for four weeks   SYNTHROID 75 MCG tablet TAKE 1 TABLET (75 MCG TOTAL) BY MOUTH DAILY BEFORE BREAKFAST.   telmisartan (MICARDIS) 20 MG tablet Take 20 mg by mouth daily.   No facility-administered medications prior to visit.    Review of Systems  Constitutional:  Negative for fatigue and fever.  Respiratory:  Negative for cough and shortness of breath.   Cardiovascular:  Negative for chest pain and leg swelling.  Gastrointestinal:  Negative for abdominal pain.  Musculoskeletal:  Positive for arthralgias and joint swelling.  Neurological:  Negative for dizziness and headaches.       Objective    BP (!) 154/81 (BP Location: Right Arm, Patient Position: Sitting, Cuff Size: Normal)   Pulse 75   Resp 16   Ht 5\' 4"  (1.626 m)   Wt 160 lb (72.6 kg)   SpO2 99%   BMI 27.46 kg/m  Physical Exam Vitals reviewed.  Constitutional:      Appearance: She is not ill-appearing.  HENT:     Head: Normocephalic.  Eyes:     Conjunctiva/sclera: Conjunctivae normal.  Cardiovascular:     Rate and Rhythm: Normal rate.  Pulmonary:     Effort: Pulmonary effort is normal. No respiratory distress.  Feet:     Right foot:     Skin integrity: Skin integrity normal.     Toenail Condition: Right toenails are normal.     Comments: First MTP joint with some erythema, ecchymosis. Minimal edema. Tenderness at this joint Neurological:      General: No focal deficit present.     Mental Status: She is alert and oriented to person, place, and time.  Psychiatric:        Mood and Affect: Mood normal.        Behavior: Behavior normal.     No results found for any visits on 01/15/22.  Assessment & Plan     Right toe pain  Does not appear to have an ingrown toenail at this time but advised to continue antibiotic therapy Gout vs arthritis flare Advised checking uric acid and ordered xray R foot   Return if symptoms worsen or fail to improve.      I, Alfredia Ferguson, PA-C have reviewed all documentation for this visit. The documentation on 01/15/2022 for the exam, diagnosis, procedures, and orders are all accurate and complete.  Alfredia Ferguson, PA-C [facility] [address] [place], [place], [address] Office: [phone] Fax: [phone]   [facility]

## 2022-01-15 NOTE — Telephone Encounter (Signed)
Saw pt today

## 2022-01-15 NOTE — Telephone Encounter (Signed)
Reason for Disposition  [1] Redness spreading into foot or red streak into foot AND [2] no fever  Answer Assessment - Initial Assessment Questions 1. ONSET: "When did the pain start?"      Seen on 12/6 told it was an infected toenail.   All my toes are swollen.   I don't think it's that.   It woke me up at 4:00 AM it was hurting so bad.   This is my 3 doses of the antibiotic.    2. LOCATION: "Where is the pain located?"   (e.g., around nail, entire toe, at foot joint)      It does not look like an infected toenail.   I have diabetes so I don't want to take changes. The big toe on right foot swollen is swollen and red.   No drainage or open wounds.   The whole toe hurts and it's hard to walk.   Now all my toes are swollen.   They were swollen when I was seen.   That's why I came in because all my toes are swollen. No pain in my foot.    No injuries. I took a shot of Ozempic on Sunday my husband thinks it's from that.   I told him that didn't have anything to do with it.    3. PAIN: "How bad is the pain?"    (Scale 1-10; or mild, moderate, severe)   -  MILD (1-3): doesn't interfere with normal activities    -  MODERATE (4-7): interferes with normal activities (e.g., work or school) or awakens from sleep, limping    -  SEVERE (8-10): excruciating pain, unable to do any normal activities, unable to walk     Painful 4. APPEARANCE: "What does the toe look like?" (e.g., redness, swelling, bruising, pallor)     Swollen and red and painful 5. CAUSE: "What do you think is causing the toe pain?"     I don't know 6. OTHER SYMPTOMS: "Do you have any other symptoms?" (e.g., leg pain, rash, fever, numbness)     No 7. PREGNANCY: "Is there any chance you are pregnant?" "When was your last menstrual period?"     N/A She wants to see another dr for a different opinion.  Protocols used: Toe Pain-A-AH

## 2022-01-16 LAB — URIC ACID: Uric Acid: 7.9 mg/dL — ABNORMAL HIGH (ref 3.0–7.2)

## 2022-01-18 ENCOUNTER — Other Ambulatory Visit: Payer: Self-pay | Admitting: Physician Assistant

## 2022-01-18 DIAGNOSIS — M109 Gout, unspecified: Secondary | ICD-10-CM

## 2022-01-18 MED ORDER — PREDNISONE 10 MG PO TABS: ORAL_TABLET | ORAL | 0 refills | Status: DC

## 2022-01-19 ENCOUNTER — Telehealth: Payer: Self-pay

## 2022-01-19 ENCOUNTER — Telehealth: Payer: Self-pay | Admitting: Pharmacy Technician

## 2022-01-19 DIAGNOSIS — Z596 Low income: Secondary | ICD-10-CM

## 2022-01-19 NOTE — Telephone Encounter (Signed)
Pt called for imaging results. Call dropped during conversation.  Shared provider's note.  Called pt back. Pt would like a copy of labs results and interpretation mailed to her. With dietary recommendations.  Pt is concerned about drug interactions with addition of prednisone. Please advise.      Sheila Herrera--   Some arthritis in that joint, but your uric acid level was high-- meaning this could be gout.   I will send in a short course of steroids!     See some diet tips to reduce the chances of another gout flare, typically you follow a low-purine diet:   What can I eat and drink on a low-purine diet?   -Grains - Popcorn, whole-grain breads, pastas, cereals, rice.   -Fruits - All fruits.   -Vegetables - All vegetables, except those that are moderate and high in purines. Vegetables to avoid or limit are listed below.   -Dairy - All types of dairy are low in purines. But low-fat or fat-free milk, yogurt, or cheeses are the best for you.   ?Meats, poultry, seafood, and proteins - Eggs, nuts, peanut butter.   -Other foods and drinks - Water, tea, coffee, cocoa, condiments like salt, herbs, olives, pickles, relishes, vinegar, olive oil, sugar, sweets, gelatin.   What foods and drinks should I avoid or limit on a low-purine diet?   -Grains to avoid or limit - Oatmeal, wheat bran, wheat germ.   -Vegetables to avoid or limit - Asparagus, spinach, cauliflower, green peas, mushrooms.   -Meats, poultry, seafood, and proteins to avoid or limit - Red meat (beef, veal, lamb, pork), poultry, bacon, organ meats (sweetbreads, liver, kidneys, heart, brains), fish (mackerel, sardines, anchovies, herring, trout, haddock, cod, tuna), seafood (scallops, mussels, shrimp, lobster, crab, oysters), tripe, wild game (goose, duck, venison), mincemeat, lunch meats, [place], dried beans, peas, lentils.   -Other foods and drinks to avoid or limit - Gravies, meat sauces, alcohol, yeast and yeast extracts (taken  as supplements). Meat- or fish-based soups, broths, and bouillons.     Sheila Ferguson, PA-C [facility] [address] [place], [place], [address] Office: [phone] Fax: [phone]  Written by Sheila Ferguson, PA-C on 01/18/2022 12:44 PM EST

## 2022-01-19 NOTE — Progress Notes (Signed)
[facility] Customer service manager [facility])                                            [facility] Team    01/19/2022  Sheila Herrera Sep 06, 1950 [id number]                                      Medication Assistance Referral  Referral From: [place] RPh Tiffany B.  Medication/Company: Franki [employer] / [employer] Patient application portion:  Mining engineer portion: Faxed  to Dr. Ronnald Ramp Provider address/fax verified via: Office website   [employer]. Jahid Weida, CPhT [facility] [employer]  [phone]

## 2022-01-19 NOTE — Telephone Encounter (Signed)
Copied from CRM [id number]. Topic: General - Other >> Jan 19, 2022  9:14 AM Esperanza Sheets wrote: Pt checking status of 12-08 lab results  Please fu w/ pt once resulted

## 2022-01-25 DIAGNOSIS — H43811 Vitreous degeneration, right eye: Secondary | ICD-10-CM | POA: Diagnosis not present

## 2022-01-27 ENCOUNTER — Ambulatory Visit: Payer: Medicare HMO | Admitting: Family Medicine

## 2022-02-05 NOTE — Progress Notes (Signed)
I,Joseline E Rosas,acting as a scribe for [employer], MD.,have documented all relevant documentation on the behalf of Ronnald Ramp, MD,as directed by  Ronnald Ramp, MD while in the presence of Ronnald Ramp, MD.   Established patient visit   Patient: Sheila Herrera   DOB: 27-Oct-1950   71 y.o. Female  MRN: [id number] Visit Date: 02/10/2022  Today's healthcare provider: Ronnald Ramp, MD   Chief Complaint  Patient presents with   Follow-Up DM and Gout   Subjective    HPI  Follow up for gout  The patient was last seen for this 3 weeks ago. Changes made at last visit include start prednisone. Report that she is doing better. She stop the Ozempic and feels better from the gout. Reports that she didn't take the Prednisone  ----------------------------------------------------------------------------------------- Diabetes Mellitus Type II, Follow-up  Lab Results  Component Value Date   HGBA1C 6.3 (A) 12/28/2021   HGBA1C 6.9 (H) 11/17/2021   HGBA1C 6.4 (H) 08/04/2021   Wt Readings from Last 3 Encounters:  02/10/22 151 lb 9.6 oz (68.8 kg)  01/15/22 160 lb (72.6 kg)  01/13/22 161 lb (73 kg)   Last seen for diabetes 1 months ago.  Management since then includes starting ozempic 0.25mg  once weekly. Patient took the Ozempic for 2 weeks. She has since stopped the ozemipic     Symptoms: No fatigue No foot ulcerations  No appetite changes No nausea  Yes paresthesia of the feet  No polydipsia  No polyuria No visual disturbances   No vomiting     Home blood sugar records:  not being checked  Episodes of hypoglycemia? No    Current insulin regiment: none  Pertinent Labs: No results found for: "CHOL", "HDL", "LDLCALC", "LDLDIRECT", "TRIG", "CHOLHDL" Lab Results  Component Value Date   NA 142 11/17/2021   K 4.0 11/17/2021   CREATININE 0.95 11/17/2021   EGFR 65 11/17/2021   LABMICR See below: 03/11/2021       Left side Pain  Patient reports pain on left axillary/thoracic region that has been present for 2 weeks Patient's husband, Lorin Picket is present for the visit  She saw cardiologist in Nov and states that she had a normal report  They state that she has not been complaining of chest pain or SOB associated with the pains  Pains occur randomly  She localized to lateral thoracic wall, sometimes in the central chest and other times beneath her ribs  The pains last 5-10 mins and feel "achy"  She states that the pain was not severe enough to 'go to the hospital'  She does not recall an injury, this started after putting up christmas decoration  She used tylenol and salonpas which helped her pains   ---------------------------------------------------------------------------------------------------   Medications: Outpatient Medications Prior to Visit  Medication Sig   Alirocumab (PRALUENT) 150 MG/ML SOAJ Inject into the skin.   amLODipine (NORVASC) 5 MG tablet Take 1 tablet by mouth daily.   aspirin 81 MG chewable tablet Chew 81 mg by mouth.   cyanocobalamin (,VITAMIN B-12,) 1000 MCG/ML injection Inject 1 mL Once Weekly Until June 2023   ergocalciferol (VITAMIN D2) 1.25 MG (50000 UT) capsule Take 1 capsule once a week for 8 weeks.   estradiol (ESTRACE) 0.1 MG/GM vaginal cream Estrogen Cream Instruction Discard applicator Apply pea sized amount to tip of finger to urethra before bed. Wash hands well after application. Use Monday, Wednesday and Friday   hydrochlorothiazide (HYDRODIURIL) 25 MG tablet hydrochlorothiazide 25 mg tablet  TAKE 1 TABLET BY MOUTH EVERY DAY   levothyroxine (SYNTHROID) 75 MCG tablet TAKE 1 TABLET BY MOUTH DAILY BEFORE BREAKFAST.   meloxicam (MOBIC) 7.5 MG tablet TAKE 1 TABLET BY MOUTH EVERY DAY   metFORMIN (GLUCOPHAGE) 500 MG tablet Take 1 tablet (500 mg total) by mouth daily with breakfast.   metoprolol succinate (TOPROL-XL) 25 MG 24 hr tablet TAKE 1 TABLET (25 MG TOTAL)  BY MOUTH DAILY.   nitroGLYCERIN (NITROSTAT) 0.4 MG SL tablet    telmisartan (MICARDIS) 20 MG tablet Take 20 mg by mouth daily.   [DISCONTINUED] ALPRAZolam (XANAX) 0.25 MG tablet Take by mouth.   [DISCONTINUED] predniSONE (DELTASONE) 10 MG tablet Take 40 mg (4 tablets) for 3 days at breakfast, and then on day 4 take 30 mg (3 tablets), on day 5 take 20 mg (2 tablets), on day 6 take 10 mg (1 tablet), and on day 7 take 5 mg ( 1/2 tablet) (Patient not taking: Reported on 02/10/2022)   [DISCONTINUED] Semaglutide,0.25 or 0.5MG /DOS, (OZEMPIC, 0.25 OR 0.5 MG/DOSE,) 2 MG/3ML SOPN Inject 0.25 mg into the skin once a week. Injection 0.25mg  once a week for four weeks   No facility-administered medications prior to visit.    Review of Systems     Objective    BP 128/82 (BP Location: Left Arm, Patient Position: Sitting, Cuff Size: Normal)   Pulse 77   Resp 16   Ht 5\' 4"  (1.626 m)   Wt 151 lb 9.6 oz (68.8 kg)   BMI 26.02 kg/m    Physical Exam Cardiovascular:     Rate and Rhythm: Normal rate and regular rhythm.     Heart sounds: Normal heart sounds.  Pulmonary:     Effort: Pulmonary effort is normal. No respiratory distress.     Breath sounds: No wheezing or rales.  Chest:       Comments: Areas of tenderness to palpation  Neurological:     Mental Status: She is alert and oriented to person, place, and time.     No results found for any visits on 02/10/22.  Assessment & Plan     Problem List Items Addressed This Visit       Endocrine   Type 2 diabetes mellitus with hyperglycemia, without long-term current use of insulin (HCC) - Primary    Well controlled  Last A1c at goal of Less than 7  Discussed dietary recommendations as patient has high anxiety about DM diagnosis and has bene restricting diet to maintain normal BG levels  See AVS for dietary recommendations  Encouraged patient to focus on eating well balanced meals and that moderation is key for consuming starches  Offered  referral for DM nutrition consult, patient prefers to try managing on her own first  Follow up in 1 month for updated A1c  Discontinued ozempic  Will likely DC metformin if A1c lower next month, for now she will continue 500mg  daily metformin          Musculoskeletal and Integument   Acute gout involving toe of right foot     Other   Chest wall pain    Acute pain  Occurs intermittently  Suspect this is MSK related as it is reproducible with palpation  Not associated with red flag symptoms such as SOB or DOE  Recommended notifying office if pain increases in severity or becomes more frequent  She can continue Tylenol and salonpas as well as recommending heating therapy and stretching  Suspect costochondritis  Will follow up in  1 month         Return in about 1 month (around 03/13/2022) for DM f/u.      I, Ronnald Ramp, MD, have reviewed all documentation for this visit.  Portions of this information were initially documented by the CMA and reviewed by me for thoroughness and accuracy.      Ronnald Ramp, MD  [facility] [phone] (phone) [phone] (fax)  [facility]

## 2022-02-08 ENCOUNTER — Other Ambulatory Visit: Payer: Self-pay | Admitting: Family Medicine

## 2022-02-08 DIAGNOSIS — M109 Gout, unspecified: Secondary | ICD-10-CM | POA: Insufficient documentation

## 2022-02-09 ENCOUNTER — Ambulatory Visit: Payer: Self-pay | Admitting: *Deleted

## 2022-02-09 ENCOUNTER — Other Ambulatory Visit: Payer: Self-pay | Admitting: Family Medicine

## 2022-02-09 NOTE — Patient Outreach (Signed)
  Care Coordination   Follow Up Visit Note   02/09/2022 Name: Sheila Herrera MRN: [id number] DOB: [birth year]  Sheila Herrera is a 72 y.o. year old female who sees Simmons-Robinson, Riki Sheer, MD for primary care. I spoke with  Percell Locus by phone today.  What matters to the patients health and wellness today?  Stopped Ozempic due to side effects.  Decreased weight, state continuing to work on BP.    Goals Addressed             This Visit's Progress    RNCM: Discuss with MD about DM   On track    Care Coordination Interventions:  Lab Results  Component Value Date   HGBA1C 6.3 (A) 12/28/2021    Provided education to patient about basic DM disease process. Review and education provided.  Counseled on importance of regular laboratory monitoring as prescribed. Patient states she will have new labwork at next visit. Review of goal of A1c and blood sugar ranges, aware of new result Discussed plans with patient for ongoing care management follow up and provided patient with direct contact information for care management team Provided patient with written educational materials related to hypo and hyperglycemia and importance of correct treatment. Review of sx and sx of hypo and hyperglycemia. Email sent with information on hypot and hyperglycemia also Reviewed scheduled/upcoming provider appointments including: PCP on 1/3 Review Ozempic side effects, stopped due to increase in uric acid and now having gout Will discuss alternative medications for weight loss and DM management to replace Ozempic.  She has lost about 20 pounds prior to stopping medication Review of the Care coordination program and the role of the RNCM. The patient agrees to work with the [facility] and agrees to [employer]. She has contact information for the [facility] and will call between outreaches if new needs or concerns arise.  Next AWV scheduled for 05-25-2022           SDOH assessments and interventions  completed:  No     Care Coordination Interventions:  Yes, provided   Follow up plan: Follow up call scheduled for 2/13    Encounter Outcome:  Pt. Visit Completed   Valente David, RN, MSN, [place] [facility] Care Management Coordinator [phone]

## 2022-02-10 ENCOUNTER — Encounter: Payer: Self-pay | Admitting: Family Medicine

## 2022-02-10 ENCOUNTER — Ambulatory Visit (INDEPENDENT_AMBULATORY_CARE_PROVIDER_SITE_OTHER): Payer: Medicare HMO | Admitting: Family Medicine

## 2022-02-10 VITALS — BP 128/82 | HR 77 | Resp 16 | Ht 64.0 in | Wt 151.6 lb

## 2022-02-10 DIAGNOSIS — Z780 Asymptomatic menopausal state: Secondary | ICD-10-CM

## 2022-02-10 DIAGNOSIS — M109 Gout, unspecified: Secondary | ICD-10-CM

## 2022-02-10 DIAGNOSIS — Z23 Encounter for immunization: Secondary | ICD-10-CM

## 2022-02-10 DIAGNOSIS — E1165 Type 2 diabetes mellitus with hyperglycemia: Secondary | ICD-10-CM | POA: Diagnosis not present

## 2022-02-10 DIAGNOSIS — R0789 Other chest pain: Secondary | ICD-10-CM | POA: Diagnosis not present

## 2022-02-10 DIAGNOSIS — Z1211 Encounter for screening for malignant neoplasm of colon: Secondary | ICD-10-CM

## 2022-02-10 NOTE — Assessment & Plan Note (Signed)
Well controlled  Last A1c at goal of Less than 7  Discussed dietary recommendations as patient has high anxiety about DM diagnosis and has bene restricting diet to maintain normal BG levels  See AVS for dietary recommendations  Encouraged patient to focus on eating well balanced meals and that moderation is key for consuming starches  Offered referral for DM nutrition consult, patient prefers to try managing on her own first  Follow up in 1 month for updated A1c  Discontinued ozempic  Will likely DC metformin if A1c lower next month, for now she will continue 500mg  daily metformin

## 2022-02-10 NOTE — Patient Instructions (Addendum)
Costochondritis  Costochondritis is irritation and swelling (inflammation) of the tissue that connects the ribs to the breastbone (sternum). This tissue is called cartilage. This condition causes pain in the front of the chest. The pain often starts slowly. It may be in more than one rib. What are the causes? The cause of this condition is not always known. It can come from stress on the sternum. The cause of this stress could be: Chest injury. Exercise or activity. This may include lifting. Very bad coughing. What increases the risk? Being female. Being 66-76 years old. Starting a new exercise or work activity. Having low levels of vitamin D. Having a condition that makes you cough a lot. What are the signs or symptoms? Chest pain that: Starts slowly. It can be sharp or dull. Gets worse with deep breathing, coughing, or exercise. Gets better with rest. May be worse when you press on your ribs and breastbone. How is this treated? In most cases, this condition goes away on its own over time. You may need to take an NSAID, such as ibuprofen. This can help reduce pain. You may also need to: Rest and stay away from activities that make pain worse. Put heat or ice on the area that hurts. Do exercises to stretch your chest muscles. If these treatments do not help, your doctor may inject a medicine to numb the area. This can help relieve the pain. Follow these instructions at home: Managing pain, stiffness, and swelling     If told, put ice on the painful area. To do this: Put ice in a plastic bag. Place a towel between your skin and the bag. Leave the ice on for 20 minutes, 2-3 times a day. If told, put heat on the affected area. Do this as often as told by your doctor. Use the heat source that your doctor recommends, such as a moist heat pack or a heating pad. Place a towel between your skin and the heat source. Leave the heat on for 20-30 minutes. If your skin turns bright red,  take off the ice or heat right away to prevent skin damage. The risk of skin damage is higher if you cannot feel pain, heat, or cold. Activity Rest as told by your doctor. Do not do things that make your pain worse. This includes activities that use your chest, belly (abdomen), and side muscles. You may have to avoid lifting. Ask your doctor how much you can safely lift. Return to your normal activities when your doctor says that it is safe. General instructions Take over-the-counter and prescription medicines only as told by your doctor. Contact a doctor if: You have chills or a fever. Your pain does not go away or gets worse. You have a cough that does not go away. Get help right away if: You have a hard time breathing. You have very bad chest pain that does not get better with medicines, heat, or ice. These symptoms may be an emergency. Get help right away. Call 911. Do not wait to see if the symptoms will go away. Do not drive yourself to the hospital. This information is not intended to replace advice given to you by your health care provider. Make sure you discuss any questions you have with your health care provider.   Diabetes Mellitus and Nutrition, Adult When you have diabetes, or diabetes mellitus, it is very important to have healthy eating habits because your blood sugar (glucose) levels are greatly affected by what you eat and  drink. Eating healthy foods in the right amounts, at about the same times every day, can help you: Manage your blood glucose. Lower your risk of heart disease. Improve your blood pressure. Reach or maintain a healthy weight. What can affect my meal plan? Every person with diabetes is different, and each person has different needs for a meal plan. Your health care provider may recommend that you work with a dietitian to make a meal plan that is best for you. Your meal plan may vary depending on factors such as: The calories you need. The medicines you  take. Your weight. Your blood glucose, blood pressure, and cholesterol levels. Your activity level. Other health conditions you have, such as heart or kidney disease. How do carbohydrates affect me? Carbohydrates, also called carbs, affect your blood glucose level more than any other type of food. Eating carbs raises the amount of glucose in your blood. It is important to know how many carbs you can safely have in each meal. This is different for every person. Your dietitian can help you calculate how many carbs you should have at each meal and for each snack. What are tips for following this plan?  Reading food labels Start by checking the serving size on the Nutrition Facts label of packaged foods and drinks. The number of calories and the amount of carbs, fats, and other nutrients listed on the label are based on one serving of the item. Many items contain more than one serving per package. Check the total grams (g) of carbs in one serving. Check the number of grams of saturated fats and trans fats in one serving. Choose foods that have a low amount or none of these fats. Check the number of milligrams (mg) of salt (sodium) in one serving. Most people should limit total sodium intake to less than 2,300 mg per day. Always check the nutrition information of foods labeled as "low-fat" or "nonfat." These foods may be higher in added sugar or refined carbs and should be avoided. Talk to your dietitian to identify your daily goals for nutrients listed on the label. Shopping Avoid buying canned, pre-made, or processed foods. These foods tend to be high in fat, sodium, and added sugar. Shop around the outside edge of the grocery store. This is where you will most often find fresh fruits and vegetables, bulk grains, fresh meats, and fresh dairy products. Cooking Use low-heat cooking methods, such as baking, instead of high-heat cooking methods, such as deep frying. Cook using healthy oils, such as  olive, canola, or sunflower oil. Avoid cooking with butter, cream, or high-fat meats. Meal planning Eat meals and snacks regularly, preferably at the same times every day. Avoid going long periods of time without eating. Eat foods that are high in fiber, such as fresh fruits, vegetables, beans, and whole grains. Eat 4-6 oz (112-168 g) of lean protein each day, such as lean meat, chicken, fish, eggs, or tofu. One ounce (oz) (28 g) of lean protein is equal to: 1 oz (28 g) of meat, chicken, or fish. 1 egg.  cup (62 g) of tofu. Eat some foods each day that contain healthy fats, such as avocado, nuts, seeds, and fish. What foods should I eat? Fruits Berries. Apples. Oranges. Peaches. Apricots. Plums. Grapes. Mangoes. Papayas. Pomegranates. Kiwi. Cherries. Vegetables Leafy greens, including lettuce, spinach, kale, chard, collard greens, mustard greens, and cabbage. Beets. Cauliflower. Broccoli. Carrots. Green beans. Tomatoes. Peppers. Onions. Cucumbers. Brussels sprouts. Grains Whole grains, such as whole-wheat or whole-grain  bread, crackers, tortillas, cereal, and pasta. Unsweetened oatmeal. Quinoa. Brown or wild rice. Meats and other proteins Seafood. Poultry without skin. Lean cuts of poultry and beef. Tofu. Nuts. Seeds. Dairy Low-fat or fat-free dairy products such as milk, yogurt, and cheese. The items listed above may not be a complete list of foods and beverages you can eat and drink. Contact a dietitian for more information. What foods should I avoid? Fruits Fruits canned with syrup. Vegetables Canned vegetables. Frozen vegetables with butter or cream sauce. Grains Refined white flour and flour products such as bread, pasta, snack foods, and cereals. Avoid all processed foods. Meats and other proteins Fatty cuts of meat. Poultry with skin. Breaded or fried meats. Processed meat. Avoid saturated fats. Dairy Full-fat yogurt, cheese, or milk. Beverages Sweetened drinks, such as soda  or iced tea. The items listed above may not be a complete list of foods and beverages you should avoid. Contact a dietitian for more information. Questions to ask a health care provider Do I need to meet with a certified diabetes care and education specialist? Do I need to meet with a dietitian? What number can I call if I have questions? When are the best times to check my blood glucose? Where to find more information: American Diabetes Association: diabetes.org Academy of Nutrition and Dietetics: eatright.[employer] of Diabetes and Digestive and Kidney Diseases: [employer] Association of Diabetes Care & Education Specialists: diabeteseducator.org Summary It is important to have healthy eating habits because your blood sugar (glucose) levels are greatly affected by what you eat and drink. It is important to use alcohol carefully. A healthy meal plan will help you manage your blood glucose and lower your risk of heart disease. Your health care provider may recommend that you work with a dietitian to make a meal plan that is best for you.

## 2022-02-10 NOTE — Assessment & Plan Note (Signed)
Acute pain  Occurs intermittently  Suspect this is MSK related as it is reproducible with palpation  Not associated with red flag symptoms such as SOB or DOE  Recommended notifying office if pain increases in severity or becomes more frequent  She can continue Tylenol and salonpas as well as recommending heating therapy and stretching  Suspect costochondritis  Will follow up in 1 month

## 2022-02-17 ENCOUNTER — Ambulatory Visit (INDEPENDENT_AMBULATORY_CARE_PROVIDER_SITE_OTHER): Payer: Medicare HMO

## 2022-02-17 DIAGNOSIS — E538 Deficiency of other specified B group vitamins: Secondary | ICD-10-CM

## 2022-02-17 MED ORDER — CYANOCOBALAMIN 1000 MCG/ML IJ SOLN
1000.0000 ug | Freq: Once | INTRAMUSCULAR | Status: AC
  Administered 2022-02-17: 1000 ug via INTRAMUSCULAR

## 2022-03-08 NOTE — Progress Notes (Unsigned)
I,Joseline E Rosas,acting as a scribe for [employer], MD.,have documented all relevant documentation on the behalf of Eulis Foster, MD,as directed by  Eulis Foster, MD while in the presence of Eulis Foster, MD.   Established patient visit   Patient: Sheila Herrera   DOB: Jun 14, 1950   72 y.o. Female  MRN: [id number] Visit Date: 03/10/2022  Today's healthcare provider: Eulis Foster, MD   Chief Complaint  Patient presents with   Follow-Up DM   Subjective    HPI  Diabetes Mellitus Type II, Follow-up  Lab Results  Component Value Date   HGBA1C 5.6 03/10/2022   HGBA1C 6.3 (A) 12/28/2021   HGBA1C 6.9 (H) 11/17/2021   Wt Readings from Last 3 Encounters:  03/10/22 147 lb 12.8 oz (67 kg)  02/10/22 151 lb 9.6 oz (68.8 kg)  01/15/22 160 lb (72.6 kg)   Last seen for diabetes 1 months ago.  Management since then includes Discontinued ozempic & 500mg  daily metformin .  Eye Exam: Due in March  Symptoms: No fatigue No foot ulcerations  No appetite changes No nausea  No paresthesia of the feet  No polydipsia  No polyuria No visual disturbances   No vomiting      Pertinent Labs: No results found for: "CHOL", "HDL", "LDLCALC", "LDLDIRECT", "TRIG", "CHOLHDL" Lab Results  Component Value Date   NA 142 11/17/2021   K 4.0 11/17/2021   CREATININE 0.95 11/17/2021   EGFR 65 11/17/2021   LABMICR See below: 03/11/2021     ---------------------------------------------------------------------------------------------------    Medications: Outpatient Medications Prior to Visit  Medication Sig   Alirocumab (PRALUENT) 150 MG/ML SOAJ Inject into the skin.   amLODipine (NORVASC) 5 MG tablet Take 1 tablet by mouth daily.   aspirin 81 MG chewable tablet Chew 81 mg by mouth.   cyanocobalamin (,VITAMIN B-12,) 1000 MCG/ML injection Inject 1 mL Once Weekly Until June 2023   ergocalciferol (VITAMIN D2) 1.25 MG (50000 UT)  capsule Take 1 capsule once a week for 8 weeks.   estradiol (ESTRACE) 0.1 MG/GM vaginal cream Estrogen Cream Instruction Discard applicator Apply pea sized amount to tip of finger to urethra before bed. Wash hands well after application. Use Monday, Wednesday and Friday   hydrochlorothiazide (HYDRODIURIL) 25 MG tablet hydrochlorothiazide 25 mg tablet  TAKE 1 TABLET BY MOUTH EVERY DAY   levothyroxine (SYNTHROID) 75 MCG tablet TAKE 1 TABLET BY MOUTH DAILY BEFORE BREAKFAST.   meloxicam (MOBIC) 7.5 MG tablet TAKE 1 TABLET BY MOUTH EVERY DAY   metFORMIN (GLUCOPHAGE) 500 MG tablet Take 1 tablet (500 mg total) by mouth daily with breakfast.   metoprolol succinate (TOPROL-XL) 25 MG 24 hr tablet TAKE 1 TABLET (25 MG TOTAL) BY MOUTH DAILY.   nitroGLYCERIN (NITROSTAT) 0.4 MG SL tablet    telmisartan (MICARDIS) 20 MG tablet Take 20 mg by mouth daily.   No facility-administered medications prior to visit.    Review of Systems     Objective    BP 119/79 (BP Location: Left Arm, Patient Position: Sitting, Cuff Size: Normal)   Pulse 66   Temp 97.8 F (36.6 C) (Oral)   Resp 16   Wt 147 lb 12.8 oz (67 kg)   BMI 25.37 kg/m    Physical Exam Vitals reviewed.  Constitutional:      General: She is not in acute distress.    Appearance: Normal appearance. She is not ill-appearing, toxic-appearing or diaphoretic.  Eyes:     Conjunctiva/sclera: Conjunctivae normal.  Cardiovascular:  Rate and Rhythm: Normal rate and regular rhythm.     Pulses: Normal pulses.     Heart sounds: Normal heart sounds. No murmur heard.    No friction rub. No gallop.  Pulmonary:     Effort: Pulmonary effort is normal. No respiratory distress.     Breath sounds: Normal breath sounds. No stridor. No wheezing, rhonchi or rales.  Abdominal:     General: Bowel sounds are normal. There is no distension.     Palpations: Abdomen is soft.     Tenderness: There is no abdominal tenderness.  Musculoskeletal:     Right lower leg:  No edema.     Left lower leg: No edema.  Skin:    Findings: No erythema or rash.  Neurological:     Mental Status: She is alert and oriented to person, place, and time.      Results for orders placed or performed in visit on 03/10/22  POCT glycosylated hemoglobin (Hb A1C)  Result Value Ref Range   Hemoglobin A1C 5.6 4.0 - 5.6 %   Est. average glucose Bld gHb Est-mCnc 114     Assessment & Plan     Problem List Items Addressed This Visit       Endocrine   Type 2 diabetes mellitus with hyperglycemia, without long-term current use of insulin (HCC) - Primary    Controlled  Repeated A1c today, 5.6  Discussed that patient can DC metformin, she would like to leave this medication on list in case she restarts it while awaiting follow up in 6 months  DM foot exam completed today  F/u in 6 months  Patient will return urine sample for urine albumin/creatine ratio      Relevant Orders   POCT glycosylated hemoglobin (Hb A1C) (Completed)   Urine microalbumin-creatinine with uACR     Return in about 6 months (around 09/08/2022) for DM .        The entirety of the information documented in the History of Present Illness, Review of Systems and Physical Exam were personally obtained by me. Portions of this information were initially documented by Lyndel Pleasure, CMA and reviewed by me for thoroughness and accuracy.Eulis Foster, MD   Eulis Foster, MD  [facility] [phone] (phone) [phone] (fax)  [place]

## 2022-03-10 ENCOUNTER — Encounter: Payer: Self-pay | Admitting: Family Medicine

## 2022-03-10 ENCOUNTER — Ambulatory Visit (INDEPENDENT_AMBULATORY_CARE_PROVIDER_SITE_OTHER): Payer: Medicare HMO | Admitting: Family Medicine

## 2022-03-10 VITALS — BP 119/79 | HR 66 | Temp 97.8°F | Resp 16 | Wt 147.8 lb

## 2022-03-10 DIAGNOSIS — E1165 Type 2 diabetes mellitus with hyperglycemia: Secondary | ICD-10-CM | POA: Diagnosis not present

## 2022-03-10 LAB — POCT GLYCOSYLATED HEMOGLOBIN (HGB A1C)
Est. average glucose Bld gHb Est-mCnc: 114
Hemoglobin A1C: 5.6 % (ref 4.0–5.6)

## 2022-03-10 MED ORDER — AMLODIPINE 5 MG TABLET
ORAL_TABLET | Freq: Every day | ORAL | 3 refills | 90 days | Status: CP
Start: 2022-03-10 — End: ?

## 2022-03-10 NOTE — Patient Instructions (Signed)
Your A1c continues to be in a goal range at 5.6.

## 2022-03-10 NOTE — Assessment & Plan Note (Signed)
Controlled  Repeated A1c today, 5.6  Discussed that patient can DC metformin, she would like to leave this medication on list in case she restarts it while awaiting follow up in 6 months  DM foot exam completed today  F/u in 6 months  Patient will return urine sample for urine albumin/creatine ratio

## 2022-03-17 ENCOUNTER — Ambulatory Visit (INDEPENDENT_AMBULATORY_CARE_PROVIDER_SITE_OTHER): Payer: Medicare HMO

## 2022-03-17 DIAGNOSIS — E538 Deficiency of other specified B group vitamins: Secondary | ICD-10-CM

## 2022-03-17 MED ORDER — CYANOCOBALAMIN 1000 MCG/ML IJ SOLN
1000.0000 ug | Freq: Once | INTRAMUSCULAR | Status: AC
  Administered 2022-03-17: 1000 ug via INTRAMUSCULAR

## 2022-03-23 ENCOUNTER — Ambulatory Visit: Payer: Self-pay | Admitting: *Deleted

## 2022-03-23 NOTE — Patient Outreach (Signed)
  Care Coordination   Follow Up Visit Note   03/23/2022 Name: Sheila Herrera MRN: [id number] DOB: February 21, 1950  Sheila Herrera is a 72 y.o. year old female who sees Simmons-Robinson, Riki Sheer, MD for primary care. I spoke with  Percell Locus by phone today.  What matters to the patients health and wellness today?  Keep DM well managed.  Denies any urgent concerns, encouraged to contact this care manager with questions.     Goals Addressed             This Visit's Progress    COMPLETED: RNCM: Discuss with MD about DM       Care Coordination Interventions:  Lab Results  Component Value Date   HGBA1C 5.6 03/10/2022    Provided education to patient about basic DM disease process. Review and education provided.  Counseled on importance of regular laboratory monitoring as prescribed. Patient states she will have new labwork at next visit. Review of goal of A1c and blood sugar ranges, aware of new result Discussed plans with patient for ongoing care management follow up and provided patient with direct contact information for care management team Provided patient with written educational materials related to hypo and hyperglycemia and importance of correct treatment. Review of sx and sx of hypo and hyperglycemia. Email sent with information on hypot and hyperglycemia also Reviewed scheduled/upcoming provider appointments including: PCP on 7/26 Review of the Care coordination program and the role of the RNCM. The patient agrees to work with the [facility] and agrees to [employer]. She has contact information for the [facility] and will call between outreaches if new needs or concerns arise.  Next AWV scheduled for 05-25-2022           SDOH assessments and interventions completed:  No     Care Coordination Interventions:  Yes, provided   Follow up plan: No further intervention required.   Encounter Outcome:  Pt. Visit Completed   Valente David, RN, MSN, [place] [facility]  Management Coordinator [phone]

## 2022-03-25 DIAGNOSIS — I259 Chronic ischemic heart disease, unspecified: Principal | ICD-10-CM

## 2022-03-25 DIAGNOSIS — I240 Acute coronary thrombosis not resulting in myocardial infarction: Principal | ICD-10-CM

## 2022-03-25 MED ORDER — REPATHA SURECLICK 140 MG/ML SUBCUTANEOUS PEN INJECTOR
SUBCUTANEOUS | 3 refills | 0 days | Status: CP
Start: 2022-03-25 — End: ?

## 2022-03-25 MED ORDER — HYDROCHLOROTHIAZIDE 25 MG TABLET
ORAL_TABLET | Freq: Every day | ORAL | 3 refills | 90 days | Status: CP
Start: 2022-03-25 — End: ?

## 2022-03-30 ENCOUNTER — Other Ambulatory Visit: Payer: Self-pay | Admitting: Physician Assistant

## 2022-03-30 ENCOUNTER — Other Ambulatory Visit: Payer: Self-pay | Admitting: Family Medicine

## 2022-03-30 DIAGNOSIS — M109 Gout, unspecified: Secondary | ICD-10-CM

## 2022-04-09 DIAGNOSIS — I240 Acute coronary thrombosis not resulting in myocardial infarction: Principal | ICD-10-CM

## 2022-04-09 DIAGNOSIS — I259 Chronic ischemic heart disease, unspecified: Principal | ICD-10-CM

## 2022-04-09 MED ORDER — REPATHA SURECLICK 140 MG/ML SUBCUTANEOUS PEN INJECTOR
SUBCUTANEOUS | 3 refills | 0 days | Status: CP
Start: 2022-04-09 — End: ?

## 2022-04-13 DIAGNOSIS — I240 Acute coronary thrombosis not resulting in myocardial infarction: Principal | ICD-10-CM

## 2022-04-13 DIAGNOSIS — I259 Chronic ischemic heart disease, unspecified: Principal | ICD-10-CM

## 2022-04-13 MED ORDER — REPATHA SURECLICK 140 MG/ML SUBCUTANEOUS PEN INJECTOR
SUBCUTANEOUS | 3 refills | 0 days | Status: CP
Start: 2022-04-13 — End: ?

## 2022-04-14 ENCOUNTER — Ambulatory Visit (INDEPENDENT_AMBULATORY_CARE_PROVIDER_SITE_OTHER): Payer: Medicare HMO

## 2022-04-14 DIAGNOSIS — E538 Deficiency of other specified B group vitamins: Secondary | ICD-10-CM | POA: Diagnosis not present

## 2022-04-14 MED ORDER — CYANOCOBALAMIN 1000 MCG/ML IJ SOLN
1000.0000 ug | Freq: Once | INTRAMUSCULAR | Status: AC
  Administered 2022-04-14: 1000 ug via INTRAMUSCULAR

## 2022-04-29 DIAGNOSIS — E119 Type 2 diabetes mellitus without complications: Secondary | ICD-10-CM | POA: Diagnosis not present

## 2022-04-29 DIAGNOSIS — H524 Presbyopia: Secondary | ICD-10-CM | POA: Diagnosis not present

## 2022-04-29 LAB — HM DIABETES EYE EXAM

## 2022-04-30 ENCOUNTER — Ambulatory Visit: Payer: Medicare HMO | Admitting: Urology

## 2022-05-19 ENCOUNTER — Ambulatory Visit (INDEPENDENT_AMBULATORY_CARE_PROVIDER_SITE_OTHER): Payer: Medicare HMO

## 2022-05-19 DIAGNOSIS — E538 Deficiency of other specified B group vitamins: Secondary | ICD-10-CM | POA: Diagnosis not present

## 2022-05-19 MED ORDER — CYANOCOBALAMIN 1000 MCG/ML IJ SOLN
1000.0000 ug | Freq: Once | INTRAMUSCULAR | Status: AC
  Administered 2022-05-19: 1000 ug via INTRAMUSCULAR

## 2022-05-25 ENCOUNTER — Ambulatory Visit (INDEPENDENT_AMBULATORY_CARE_PROVIDER_SITE_OTHER): Payer: Medicare HMO

## 2022-05-25 VITALS — Ht 64.0 in | Wt 139.0 lb

## 2022-05-25 DIAGNOSIS — Z1231 Encounter for screening mammogram for malignant neoplasm of breast: Secondary | ICD-10-CM

## 2022-05-25 DIAGNOSIS — Z0000 Encounter for general adult medical examination without abnormal findings: Secondary | ICD-10-CM | POA: Diagnosis not present

## 2022-05-25 NOTE — Patient Instructions (Signed)
[name] , Thank you for taking time to come for your Medicare Wellness Visit. I appreciate your ongoing commitment to your health goals. Please review the following plan we discussed and let me know if I can assist you in the future.   These are the goals we discussed:  Goals      DIET - EAT MORE FRUITS AND VEGETABLES        This is a list of the screening recommended for you and due dates:  Health Maintenance  Topic Date Due   COVID-19 Vaccine (1) Never done   Eye exam for diabetics  Never done   Yearly kidney health urinalysis for diabetes  Never done   Hepatitis C Screening: USPSTF Recommendation to screen - Ages 70-79 yo.  Never done   Colon Cancer Screening  Never done   Zoster (Shingles) Vaccine (1 of 2) Never done   Mammogram  03/11/2014   Pneumonia Vaccine (1 of 1 - PCV) Never done   DEXA scan (bone density measurement)  Never done   Hemoglobin A1C  09/08/2022   Flu Shot  09/09/2022   Yearly kidney function blood test for diabetes  11/18/2022   Complete foot exam   03/11/2023   Medicare Annual Wellness Visit  05/25/2023   DTaP/Tdap/Td vaccine (2 - Td or Tdap) 05/13/2025   HPV Vaccine  Aged Out    Advanced directives: no paperwork mailed by request  Conditions/risks identified: none  Next appointment: Follow up in one year for your annual wellness visit 05/30/23 @ 9:45am telephone   Preventive Care 65 Years and Older, Female Preventive care refers to lifestyle choices and visits with your health care provider that can promote health and wellness. What does preventive care include? A yearly physical exam. This is also called an annual well check. Dental exams once or twice a year. Routine eye exams. Ask your health care provider how often you should have your eyes checked. Personal lifestyle choices, including: Daily care of your teeth and gums. Regular physical activity. Eating a healthy diet. Avoiding tobacco and drug use. Limiting alcohol  use. Practicing safe sex. Taking low-dose aspirin every day. Taking vitamin and mineral supplements as recommended by your health care provider. What happens during an annual well check? The services and screenings done by your health care provider during your annual well check will depend on your age, overall health, lifestyle risk factors, and family history of disease. Counseling  Your health care provider may ask you questions about your: Alcohol use. Tobacco use. Drug use. Emotional well-being. Home and relationship well-being. Sexual activity. Eating habits. History of falls. Memory and ability to understand (cognition). Work and work Astronomer. Reproductive health. Screening  You may have the following tests or measurements: Height, weight, and BMI. Blood pressure. Lipid and cholesterol levels. These may be checked every 5 years, or more frequently if you are over 45 years old. Skin check. Lung cancer screening. You may have this screening every year starting at age 5 if you have a 30-pack-year history of smoking and currently smoke or have quit within the past 15 years. Fecal occult blood test (FOBT) of the stool. You may have this test every year starting at age 18. Flexible sigmoidoscopy or colonoscopy. You may have a sigmoidoscopy every 5 years or a colonoscopy every 10 years starting at age 67. Hepatitis C blood test. Hepatitis B blood test. Sexually transmitted disease (STD) testing. Diabetes screening. This is done by checking your blood sugar (glucose) after you  have not eaten for a while (fasting). You may have this done every 1-3 years. Bone density scan. This is done to screen for osteoporosis. You may have this done starting at age 44. Mammogram. This may be done every 1-2 years. Talk to your health care provider about how often you should have regular mammograms. Talk with your health care provider about your test results, treatment options, and if necessary,  the need for more tests. Vaccines  Your health care provider may recommend certain vaccines, such as: Influenza vaccine. This is recommended every year. Tetanus, diphtheria, and acellular pertussis (Tdap, Td) vaccine. You may need a Td booster every 10 years. Zoster vaccine. You may need this after age 70. Pneumococcal 13-valent conjugate (PCV13) vaccine. One dose is recommended after age 70. Pneumococcal polysaccharide (PPSV23) vaccine. One dose is recommended after age 70. Talk to your health care provider about which screenings and vaccines you need and how often you need them. This information is not intended to replace advice given to you by your health care provider. Make sure you discuss any questions you have with your health care provider. Document Released: 02/21/2015 Document Revised: 10/15/2015 Document Reviewed: 11/26/2014 Elsevier Interactive Patient Education  2017 [place] Prevention in the Home Falls can cause injuries. They can happen to people of all ages. There are many things you can do to make your home safe and to help prevent falls. What can I do on the outside of my home? Regularly fix the edges of walkways and driveways and fix any cracks. Remove anything that might make you trip as you walk through a door, such as a raised step or threshold. Trim any bushes or trees on the path to your home. Use bright outdoor lighting. Clear any walking paths of anything that might make someone trip, such as rocks or tools. Regularly check to see if handrails are loose or broken. Make sure that both sides of any steps have handrails. Any raised decks and porches should have guardrails on the edges. Have any leaves, snow, or ice cleared regularly. Use sand or salt on walking paths during winter. Clean up any spills in your garage right away. This includes oil or grease spills. What can I do in the bathroom? Use night lights. Install grab bars by the toilet and in the  tub and shower. Do not use towel bars as grab bars. Use non-skid mats or decals in the tub or shower. If you need to sit down in the shower, use a plastic, non-slip stool. Keep the floor dry. Clean up any water that spills on the floor as soon as it happens. Remove soap buildup in the tub or shower regularly. Attach bath mats securely with double-sided non-slip rug tape. Do not have throw rugs and other things on the floor that can make you trip. What can I do in the bedroom? Use night lights. Make sure that you have a light by your bed that is easy to reach. Do not use any sheets or blankets that are too big for your bed. They should not hang down onto the floor. Have a firm chair that has side arms. You can use this for support while you get dressed. Do not have throw rugs and other things on the floor that can make you trip. What can I do in the kitchen? Clean up any spills right away. Avoid walking on wet floors. Keep items that you use a lot in easy-to-reach places. If you need  to reach something above you, use a strong step stool that has a grab bar. Keep electrical cords out of the way. Do not use floor polish or wax that makes floors slippery. If you must use wax, use non-skid floor wax. Do not have throw rugs and other things on the floor that can make you trip. What can I do with my stairs? Do not leave any items on the stairs. Make sure that there are handrails on both sides of the stairs and use them. Fix handrails that are broken or loose. Make sure that handrails are as long as the stairways. Check any carpeting to make sure that it is firmly attached to the stairs. Fix any carpet that is loose or worn. Avoid having throw rugs at the top or bottom of the stairs. If you do have throw rugs, attach them to the floor with carpet tape. Make sure that you have a light switch at the top of the stairs and the bottom of the stairs. If you do not have them, ask someone to add them for  you. What else can I do to help prevent falls? Wear shoes that: Do not have high heels. Have rubber bottoms. Are comfortable and fit you well. Are closed at the toe. Do not wear sandals. If you use a stepladder: Make sure that it is fully opened. Do not climb a closed stepladder. Make sure that both sides of the stepladder are locked into place. Ask someone to hold it for you, if possible. Clearly mark and make sure that you can see: Any grab bars or handrails. First and last steps. Where the edge of each step is. Use tools that help you move around (mobility aids) if they are needed. These include: Canes. Walkers. Scooters. Crutches. Turn on the lights when you go into a dark area. Replace any light bulbs as soon as they burn out. Set up your furniture so you have a clear path. Avoid moving your furniture around. If any of your floors are uneven, fix them. If there are any pets around you, be aware of where they are. Review your medicines with your doctor. Some medicines can make you feel dizzy. This can increase your chance of falling. Ask your doctor what other things that you can do to help prevent falls. This information is not intended to replace advice given to you by your health care provider. Make sure you discuss any questions you have with your health care provider. Document Released: 11/21/2008 Document Revised: 07/03/2015 Document Reviewed: 03/01/2014 Elsevier Interactive Patient Education  2017 [employer].

## 2022-05-25 NOTE — Progress Notes (Signed)
I connected with  [name] on 05/25/22 by a audio enabled telemedicine application and verified that I am speaking with the correct person using two identifiers.  Patient Location: Home  Provider Location: Office/Clinic  I discussed the limitations of evaluation and management by telemedicine. The patient expressed understanding and agreed to proceed.  Subjective:   Sheila Herrera is a 72 y.o. female who presents for Medicare Annual (Subsequent) preventive examination.  Review of Systems     Cardiac Risk Factors include: advanced age (>43men, >34 women);diabetes mellitus;hypertension     Objective:    Today's Vitals   05/25/22 1024  Weight: 139 lb (63 kg)  Height: 5\' 4"  (1.626 m)   Body mass index is 23.86 kg/m.     05/25/2022   10:33 AM 06/03/2021    8:58 AM 05/20/2021   11:04 AM  Advanced Directives  Does Patient Have a Medical Advance Directive? No No No  Would patient like information on creating a medical advance directive?   No - Patient declined    Current Medications (verified) Outpatient Encounter Medications as of 05/25/2022  Medication Sig   amLODipine (NORVASC) 5 MG tablet Take 1 tablet by mouth daily.   aspirin 81 MG chewable tablet Chew 81 mg by mouth.   ergocalciferol (VITAMIN D2) 1.25 MG (50000 UT) capsule Take 1 capsule once a week for 8 weeks.   estradiol (ESTRACE) 0.1 MG/GM vaginal cream Estrogen Cream Instruction Discard applicator Apply pea sized amount to tip of finger to urethra before bed. Wash hands well after application. Use Monday, Wednesday and Friday   hydrochlorothiazide (HYDRODIURIL) 25 MG tablet hydrochlorothiazide 25 mg tablet  TAKE 1 TABLET BY MOUTH EVERY DAY   levothyroxine (SYNTHROID) 75 MCG tablet TAKE 1 TABLET BY MOUTH DAILY BEFORE BREAKFAST.   meloxicam (MOBIC) 7.5 MG tablet TAKE 1 TABLET BY MOUTH EVERY DAY   metFORMIN (GLUCOPHAGE) 500 MG tablet Take 1 tablet (500 mg total) by mouth daily with breakfast.   metoprolol  succinate (TOPROL-XL) 25 MG 24 hr tablet TAKE 1 TABLET (25 MG TOTAL) BY MOUTH DAILY.   nitroGLYCERIN (NITROSTAT) 0.4 MG SL tablet    telmisartan (MICARDIS) 20 MG tablet Take 20 mg by mouth daily.   Alirocumab (PRALUENT) 150 MG/ML SOAJ Inject into the skin. (Patient not taking: Reported on 05/25/2022)   No facility-administered encounter medications on file as of 05/25/2022.    Allergies (verified) Patient has no known allergies.   History: Past Medical History:  Diagnosis Date   Hypertension    Past Surgical History:  Procedure Laterality Date   CARDIAC SURGERY     open heart surgery 1958   CORONARY STENT PLACEMENT     VEIN SURGERY     vein graph in leg due to open heart surgery, 1966   Family History  Problem Relation Age of Onset   Cancer Father        lung   Arthritis Mother    Heart disease Sister        quadriple by pass   Heart disease Brother    OCD Son    Social History   Socioeconomic History   Marital status: Married    Spouse name: Not on file   Number of children: Not on file   Years of education: Not on file   Highest education level: Not on file  Occupational History   Not on file  Tobacco Use   Smoking status: Never   Smokeless tobacco: Never  Vaping Use  Vaping Use: Never used  Substance and Sexual Activity   Alcohol use: No   Drug use: No   Sexual activity: Yes    Birth control/protection: Post-menopausal  Other Topics Concern   Not on file  Social History Narrative   Not on file   Social Determinants of Health   Financial Resource Strain: Low Risk  (05/24/2022)   Overall Financial Resource Strain (CARDIA)    Difficulty of Paying Living Expenses: Not hard at all  Food Insecurity: No Food Insecurity (05/24/2022)   Hunger Vital Sign    Worried About Running Out of Food in the Last Year: Never true    Ran Out of Food in the Last Year: Never true  Transportation Needs: No Transportation Needs (05/24/2022)   PRAPARE - Therapist, art (Medical): No    Lack of Transportation (Non-Medical): No  Physical Activity: Insufficiently Active (05/24/2022)   Exercise Vital Sign    Days of Exercise per Week: 4 days    Minutes of Exercise per Session: 30 min  Stress: No Stress Concern Present (05/24/2022)   [employer] of Occupational Health - Occupational Stress Questionnaire    Feeling of Stress : Not at all  Social Connections: Unknown (05/24/2022)   Social Connection and Isolation Panel [NHANES]    Frequency of Communication with Friends and Family: More than three times a week    Frequency of Social Gatherings with Friends and Family: Three times a week    Attends Religious Services: Not on file    Active Member of Clubs or Organizations: No    Attends Banker Meetings: Never    Marital Status: Married    Tobacco Counseling Counseling given: Not Answered   Clinical Intake:  Pre-visit preparation completed: Yes  Pain : No/denies pain     BMI - recorded: 23.86 Nutritional Status: BMI of 19-24  Normal Nutritional Risks: None Diabetes: Yes CBG done?: No Did pt. bring in CBG monitor from home?: No  How often do you need to have someone help you when you read instructions, pamphlets, or other written materials from your doctor or pharmacy?: 1 - Never  Diabetic?yes Interpreter Needed?: No Comments: lives with husband Information entered by :: B.Levante Simones,LPN   Activities of Daily Living    05/24/2022    8:12 PM 01/15/2022    1:22 PM  In your present state of health, do you have any difficulty performing the following activities:  Hearing? 0 0  Vision? 0 0  Difficulty concentrating or making decisions? 0 0  Walking or climbing stairs? 0 0  Dressing or bathing? 0 0  Doing errands, shopping? 0 0  Preparing Food and eating ? N   Using the Toilet? N   In the past six months, have you accidently leaked urine? N   Do you have problems with loss of bowel control? N    Managing your Medications? N   Managing your Finances? N   Housekeeping or managing your Housekeeping? N     Patient Care Team: Ronnald Ramp, MD as PCP - General (Family Medicine) Marlowe Sax, RN as Case Manager (General Practice) Kemper Durie, RN as [facility] Care Management  Indicate any recent Medical Services you may have received from other than [facility] providers in the past year (date may be approximate).     Assessment:   This is a routine wellness examination for Kir[place].  Hearing/Vision screen Hearing Screening - Comments:: Adequate hearing Vision Screening -  Comments:: Adequate vision;wears contact in rt eye [facility]  Dietary issues and exercise activities discussed: Current Exercise Habits: Home exercise routine, Type of exercise: treadmill;walking, Time (Minutes): 30, Frequency (Times/Week): 4, Weekly Exercise (Minutes/Week): 120, Intensity: Mild, Exercise limited by: cardiac condition(s);neurologic condition(s)   Goals Addressed             This Visit's Progress    DIET - EAT MORE FRUITS AND VEGETABLES   On track      Depression Screen    05/25/2022   10:29 AM 02/10/2022    8:36 AM 01/15/2022    1:22 PM 12/28/2021    2:52 PM 11/26/2021    2:26 PM 05/20/2021   11:02 AM 05/14/2015    1:55 PM  PHQ 2/9 Scores  PHQ - 2 Score 0 0 0 0 0 0 0  PHQ- 9 Score  0 0 0       Fall Risk    05/24/2022    8:12 PM 02/10/2022    8:35 AM 01/15/2022    1:22 PM 12/28/2021    2:52 PM 11/26/2021    2:26 PM  Fall Risk   Falls in the past year? 0 0 0 0 0  Number falls in past yr:  0 0 0 0  Injury with Fall?  0 0 0 0  Risk for fall due to :  No Fall Risks No Fall Risks No Fall Risks No Fall Risks  Follow up   Falls evaluation completed Falls evaluation completed     FALL RISK PREVENTION PERTAINING TO THE HOME:  Any stairs in or around the home? Yes  If so, are there any without handrails? Yes  Home free of loose throw rugs in walkways, pet  beds, electrical cords, etc? Yes  Adequate lighting in your home to reduce risk of falls? Yes   ASSISTIVE DEVICES UTILIZED TO PREVENT FALLS:  Life alert? No  Use of a cane, walker or w/c? No  Grab bars in the bathroom? No  Shower chair or bench in shower? No  Elevated toilet seat or a handicapped toilet? No    Cognitive Function:        05/25/2022   10:48 AM  6CIT Screen  What Year? 0 points  What month? 0 points  What time? 0 points  Count back from 20 0 points  Months in reverse 0 points  Repeat phrase 0 points  Total Score 0 points    Immunizations Immunization History  Administered Date(s) Administered   Fluad Quad(high Dose 65+) 11/26/2021   Influenza,inj,Quad PF,6+ Mos 12/29/2017, 11/26/2020   Tdap 05/14/2015   Zoster, Live 05/14/2015    TDAP status: Up to date  Flu Vaccine status: Up to date  Pneumococcal vaccine status:  pt DECLINES  Covid-19 vaccine status:  PT DECLINES  Qualifies for Shingles Vaccine? Yes   Zostavax completed No   Shingrix Completed?: No.    Education has been provided regarding the importance of this vaccine. Patient has been advised to call insurance company to determine out of pocket expense if they have not yet received this vaccine. Advised may also receive vaccine at local pharmacy or Health Dept. Verbalized acceptance and understanding.  Screening Tests Health Maintenance  Topic Date Due   COVID-19 Vaccine (1) Never done   OPHTHALMOLOGY EXAM  Never done   Diabetic kidney evaluation - Urine ACR  Never done   Hepatitis C Screening  Never done   COLONOSCOPY (Pts 45-43yrs Insurance coverage will need to be confirmed)  Never done   Zoster Vaccines- Shingrix (1 of 2) Never done   MAMMOGRAM  03/11/2014   Pneumonia Vaccine 32+ Years old (1 of 1 - PCV) Never done   DEXA SCAN  Never done   HEMOGLOBIN A1C  09/08/2022   INFLUENZA VACCINE  09/09/2022   Diabetic kidney evaluation - eGFR measurement  11/18/2022   FOOT EXAM  03/11/2023    Medicare Annual Wellness (AWV)  05/25/2023   DTaP/Tdap/Td (2 - Td or Tdap) 05/13/2025   HPV VACCINES  Aged Out    Health Maintenance  Health Maintenance Due  Topic Date Due   COVID-19 Vaccine (1) Never done   OPHTHALMOLOGY EXAM  Never done   Diabetic kidney evaluation - Urine ACR  Never done   Hepatitis C Screening  Never done   COLONOSCOPY (Pts 45-66yrs Insurance coverage will need to be confirmed)  Never done   Zoster Vaccines- Shingrix (1 of 2) Never done   MAMMOGRAM  03/11/2014   Pneumonia Vaccine 20+ Years old (1 of 1 - PCV) Never done   DEXA SCAN  Never done    Colorectal cancer screening: Type of screening: Colonoscopy. Completed NO Pt needs discuss w/PCP  Mammogram status: Ordered yes. Pt provided with contact info and advised to call to schedule appt.   Bone Density status: Completed no. Results reflect: Bone density results: NORMAL. Repeat every 5 years.  Lung Cancer Screening: (Low Dose CT Chest recommended if Age 38-80 years, 30 pack-year currently smoking OR have quit w/in 15years.) does not qualify.   Lung Cancer Screening Referral: no  Additional Screening:  Hepatitis C Screening: does not qualify; Completed yes  Vision Screening: Recommended annual ophthalmology exams for early detection of glaucoma and other disorders of the eye. Is the patient up to date with their annual eye exam?  Yes  Who is the provider or what is the name of the office in which the patient attends annual eye exams? [facility] If pt is not established with a provider, would they like to be referred to a provider to establish care? No .   Dental Screening: Recommended annual dental exams for proper oral hygiene  Community Resource Referral / Chronic Care Management: CRR required this visit?  No   CCM required this visit?  No      Plan:     I have personally reviewed and noted the following in the patient's chart:   Medical and social history Use of alcohol, tobacco or  illicit drugs  Current medications and supplements including opioid prescriptions. Patient is not currently taking opioid prescriptions. Functional ability and status Nutritional status Physical activity Advanced directives List of other physicians Hospitalizations, surgeries, and ER visits in previous 12 months Vitals Screenings to include cognitive, depression, and falls Referrals and appointments  In addition, I have reviewed and discussed with patient certain preventive protocols, quality metrics, and best practice recommendations. A written personalized care plan for preventive services as well as general preventive health recommendations were provided to patient.     Sue Lush, LPN   1/61/0960   Nurse Notes: The patient states she is doing well and has no concerns or questions at this time. She does relay she was experiencing muscle aches with Repatha. She relays she discontinued taking Repatha and the aches ceased . To discuss other options at next visit.

## 2022-06-10 DIAGNOSIS — I259 Chronic ischemic heart disease, unspecified: Principal | ICD-10-CM

## 2022-06-10 DIAGNOSIS — I240 Acute coronary thrombosis not resulting in myocardial infarction: Principal | ICD-10-CM

## 2022-06-10 MED ORDER — REPATHA PUSHTRONEX 420 MG/3.5 ML SUBCUTANEOUS WEARABLE INJECTOR
0 refills | 0 days
Start: 2022-06-10 — End: ?

## 2022-06-10 MED ORDER — ALIROCUMAB 150 MG/ML SUBCUTANEOUS PEN INJECTOR
SUBCUTANEOUS | 3 refills | 84 days | Status: CP
Start: 2022-06-10 — End: ?

## 2022-06-16 ENCOUNTER — Ambulatory Visit (INDEPENDENT_AMBULATORY_CARE_PROVIDER_SITE_OTHER): Payer: Medicare HMO

## 2022-06-16 DIAGNOSIS — E538 Deficiency of other specified B group vitamins: Secondary | ICD-10-CM

## 2022-06-16 MED ORDER — CYANOCOBALAMIN 1000 MCG/ML IJ SOLN
1000.0000 ug | Freq: Once | INTRAMUSCULAR | Status: AC
  Administered 2022-06-16: 1000 ug via INTRAMUSCULAR

## 2022-06-29 ENCOUNTER — Encounter: Payer: Self-pay | Admitting: Family Medicine

## 2022-06-29 ENCOUNTER — Ambulatory Visit (INDEPENDENT_AMBULATORY_CARE_PROVIDER_SITE_OTHER): Payer: Medicare HMO | Admitting: Family Medicine

## 2022-06-29 VITALS — BP 143/71 | HR 69 | Wt 135.4 lb

## 2022-06-29 DIAGNOSIS — Z780 Asymptomatic menopausal state: Secondary | ICD-10-CM | POA: Diagnosis not present

## 2022-06-29 DIAGNOSIS — Z1231 Encounter for screening mammogram for malignant neoplasm of breast: Secondary | ICD-10-CM | POA: Diagnosis not present

## 2022-06-29 DIAGNOSIS — R109 Unspecified abdominal pain: Secondary | ICD-10-CM | POA: Insufficient documentation

## 2022-06-29 DIAGNOSIS — D171 Benign lipomatous neoplasm of skin and subcutaneous tissue of trunk: Secondary | ICD-10-CM | POA: Diagnosis not present

## 2022-06-29 NOTE — Assessment & Plan Note (Signed)
Due for screening for mammogram, denies breast concerns, provided with phone number to call and schedule appointment for mammogram. Encouraged to repeat breast cancer screening every 1-2 years.  

## 2022-06-29 NOTE — Assessment & Plan Note (Signed)
Acute, improving following abdominal pain/distention in setting of dietary intake Hx of abdominal vascular surgery at R groin; without concerns Continue to monitor s/s; we can refer for [place] if worsening

## 2022-06-29 NOTE — Assessment & Plan Note (Signed)
Due for DEXA. 

## 2022-06-29 NOTE — Progress Notes (Signed)
I,Sheila Herrera,acting as a Neurosurgeon for Sheila Kindle, FNP.,have documented all relevant documentation on the behalf of Sheila Kindle, FNP,as directed by  Sheila Kindle, FNP while in the presence of Sheila Kindle, FNP.   Established patient visit  Patient: Sheila Herrera   DOB: 11/11/1950   72 y.o. Female  MRN: [id number] Visit Date: 06/29/2022  Today's healthcare provider: Jacky Kindle, FNP  Introduced to nurse practitioner role and practice setting.  All questions answered.  Discussed provider/patient relationship and expectations.  Subjective    -Pneumonia vaccine: declined -colonoscopy: has cologuard at home -eye exam: request of information sent -dexa scan: order placed -mammogram: order placed  Flank Pain This is a recurrent problem. The current episode started in the past 7 days (Sunday). The problem occurs intermittently. The problem is unchanged. Quality: soreness. The pain does not radiate. The pain is moderate. The symptoms are aggravated by bending. She has tried nothing for the symptoms.    Pain started after straining when going to the restroom to make a bowel movement due to consuming lots of cheese.    Medications: Outpatient Medications Prior to Visit  Medication Sig   Alirocumab (PRALUENT) 150 MG/ML SOAJ Inject into the skin. (Patient not taking: Reported on 05/25/2022)   amLODipine (NORVASC) 5 MG tablet Take 1 tablet by mouth daily.   aspirin 81 MG chewable tablet Chew 81 mg by mouth.   ergocalciferol (VITAMIN D2) 1.25 MG (50000 UT) capsule Take 1 capsule once a week for 8 weeks.   estradiol (ESTRACE) 0.1 MG/GM vaginal cream Estrogen Cream Instruction Discard applicator Apply pea sized amount to tip of finger to urethra before bed. Wash hands well after application. Use Monday, Wednesday and Friday   hydrochlorothiazide (HYDRODIURIL) 25 MG tablet hydrochlorothiazide 25 mg tablet  TAKE 1 TABLET BY MOUTH EVERY DAY   levothyroxine (SYNTHROID) 75 MCG tablet TAKE  1 TABLET BY MOUTH DAILY BEFORE BREAKFAST.   meloxicam (MOBIC) 7.5 MG tablet TAKE 1 TABLET BY MOUTH EVERY DAY   metFORMIN (GLUCOPHAGE) 500 MG tablet Take 1 tablet (500 mg total) by mouth daily with breakfast.   metoprolol succinate (TOPROL-XL) 25 MG 24 hr tablet TAKE 1 TABLET (25 MG TOTAL) BY MOUTH DAILY.   nitroGLYCERIN (NITROSTAT) 0.4 MG SL tablet    telmisartan (MICARDIS) 20 MG tablet Take 20 mg by mouth daily.   No facility-administered medications prior to visit.    Review of Systems  Genitourinary:  Positive for flank pain.     Objective    BP (!) 143/71 (BP Location: Left Arm, Patient Position: Sitting, Cuff Size: Normal)   Pulse 69   Wt 135 lb 6.4 oz (61.4 kg)   SpO2 100%   BMI 23.24 kg/m   BP Readings from Last 3 Encounters:  06/29/22 (!) 143/71  03/10/22 119/79  02/10/22 128/82   Wt Readings from Last 3 Encounters:  06/29/22 135 lb 6.4 oz (61.4 kg)  05/25/22 139 lb (63 kg)  03/10/22 147 lb 12.8 oz (67 kg)   Physical Exam Vitals and nursing note reviewed.  Constitutional:      General: She is not in acute distress.    Appearance: Normal appearance. She is normal weight. She is not ill-appearing, toxic-appearing or diaphoretic.  HENT:     Head: Normocephalic and atraumatic.  Cardiovascular:     Rate and Rhythm: Normal rate and regular rhythm.     Pulses: Normal pulses.     Heart sounds: Normal heart sounds. No murmur  heard.    No friction rub. No gallop.  Pulmonary:     Effort: Pulmonary effort is normal. No respiratory distress.     Breath sounds: Normal breath sounds. No stridor. No wheezing, rhonchi or rales.  Chest:     Chest wall: No tenderness.  Abdominal:     General: Bowel sounds are normal.     Palpations: Abdomen is soft. There is mass.     Tenderness: There is no abdominal tenderness. There is no guarding.     Comments: Soft, slightly tender, 1.5 cm round RLQ mass c/f lipoma which was inflamed following large BM and ongoing gas complaints    Musculoskeletal:        General: No swelling, tenderness, deformity or signs of injury. Normal range of motion.     Right lower leg: No edema.     Left lower leg: No edema.  Skin:    General: Skin is warm and dry.     Capillary Refill: Capillary refill takes less than 2 seconds.     Coloration: Skin is not jaundiced or pale.     Findings: No bruising, erythema, lesion or rash.  Neurological:     General: No focal deficit present.     Mental Status: She is alert and oriented to person, place, and time. Mental status is at baseline.     Cranial Nerves: No cranial nerve deficit.     Sensory: No sensory deficit.     Motor: No weakness.     Coordination: Coordination normal.  Psychiatric:        Mood and Affect: Mood normal.        Behavior: Behavior normal.        Thought Content: Thought content normal.        Judgment: Judgment normal.     Results for orders placed or performed in visit on 06/29/22  HM DIABETES EYE EXAM  Result Value Ref Range   HM Diabetic Eye Exam No Retinopathy No Retinopathy    Assessment & Plan     Problem List Items Addressed This Visit       Other   Encounter for screening mammogram for malignant neoplasm of breast    Due for screening for mammogram, denies breast concerns, provided with phone number to call and schedule appointment for mammogram. Encouraged to repeat breast cancer screening every 1-2 years.       Relevant Orders   MM 3D SCREENING MAMMOGRAM BILATERAL BREAST   Lipoma of abdominal wall - Primary    Acute, improving following abdominal pain/distention in setting of dietary intake Hx of abdominal vascular surgery at R groin; without concerns Continue to monitor s/s; we can refer for [place] if worsening       Postmenopausal    Due for DEXA      Relevant Orders   DG Bone Density   Return if symptoms worsen or fail to improve.     Sheila Merl, FNP, have reviewed all documentation for this visit. The documentation on 06/29/22  for the exam, diagnosis, procedures, and orders are all accurate and complete.  Sheila Kindle, FNP  [facility] [phone] (phone) [phone] (fax)  [facility]

## 2022-07-14 ENCOUNTER — Ambulatory Visit (INDEPENDENT_AMBULATORY_CARE_PROVIDER_SITE_OTHER): Payer: Medicare HMO

## 2022-07-14 DIAGNOSIS — E538 Deficiency of other specified B group vitamins: Secondary | ICD-10-CM

## 2022-07-14 MED ORDER — CYANOCOBALAMIN 1000 MCG/ML IJ SOLN
1000.0000 ug | Freq: Once | INTRAMUSCULAR | Status: AC
  Administered 2022-07-14: 1000 ug via INTRAMUSCULAR

## 2022-08-05 ENCOUNTER — Other Ambulatory Visit: Payer: Self-pay | Admitting: Family Medicine

## 2022-08-11 ENCOUNTER — Ambulatory Visit (INDEPENDENT_AMBULATORY_CARE_PROVIDER_SITE_OTHER): Payer: Medicare HMO

## 2022-08-11 DIAGNOSIS — E538 Deficiency of other specified B group vitamins: Secondary | ICD-10-CM | POA: Diagnosis not present

## 2022-08-11 MED ORDER — CYANOCOBALAMIN 1000 MCG/ML IJ SOLN
1000.0000 ug | Freq: Once | INTRAMUSCULAR | Status: AC
  Administered 2022-08-11: 1000 ug via INTRAMUSCULAR

## 2022-08-18 ENCOUNTER — Encounter: Payer: Self-pay | Admitting: Physician Assistant

## 2022-08-18 ENCOUNTER — Ambulatory Visit: Payer: Self-pay | Admitting: *Deleted

## 2022-08-18 ENCOUNTER — Ambulatory Visit (INDEPENDENT_AMBULATORY_CARE_PROVIDER_SITE_OTHER): Payer: Medicare HMO | Admitting: Physician Assistant

## 2022-08-18 VITALS — BP 129/70 | Temp 98.0°F | Resp 12 | Ht 64.0 in | Wt 133.0 lb

## 2022-08-18 DIAGNOSIS — R21 Rash and other nonspecific skin eruption: Secondary | ICD-10-CM | POA: Diagnosis not present

## 2022-08-18 DIAGNOSIS — R222 Localized swelling, mass and lump, trunk: Secondary | ICD-10-CM | POA: Diagnosis not present

## 2022-08-18 DIAGNOSIS — E1165 Type 2 diabetes mellitus with hyperglycemia: Secondary | ICD-10-CM | POA: Diagnosis not present

## 2022-08-18 MED ORDER — FLUCONAZOLE 100 MG PO TABS: 100.0000 mg | ORAL_TABLET | Freq: Every day | ORAL | 0 refills | Status: DC

## 2022-08-18 MED ORDER — NYSTATIN 100000 UNIT/GM EX POWD: 1.0000 | Freq: Three times a day (TID) | CUTANEOUS | 0 refills | Status: AC

## 2022-08-18 NOTE — Telephone Encounter (Signed)
Agree with appt scheduled today for evaluation

## 2022-08-18 NOTE — Progress Notes (Signed)
Established patient visit  Patient: Sheila Herrera   DOB: 1950-12-06   72 y.o. Female  MRN: [id number] Visit Date: 08/18/2022  Today's healthcare provider: Debera Lat, PA-C   Chief Complaint  Patient presents with   Acute Visit   Subjective     HPI   Patient C/O recurrent discharge from naval area. This episode started 2 days ago. She reports using baby powder, reports good symptom control. She reports white discharge from [employer] area yesterday. She reports some soreness around [employer] area. Patient c/o pain on right lower abdomen area. She reports that in may shewas told she had a cyst. Last edited by Myles Lipps, CMA on 08/18/2022 10:03 AM.      Discussed the use of AI scribe software for clinical note transcription with the patient, who gave verbal consent to proceed.  History of Present Illness   The patient presents with a weeping navel and abdominal pain. They have a history of prediabetes and have been managing their condition with dietary changes, resulting in a significant weight loss of 39 pounds. They are due to have their A1c levels checked soon. They have been applying baby powder to their navel to manage the weeping.  The patient also reports a tender cyst on their side, which has been causing discomfort for the past couple of days. The cyst is not associated with any changes in size, consistency, or border. The pain is not associated with standing or lifting and is not constant. The patient has had UTIs in the past but does not currently have any urinary symptoms. Bowel movements are regular and not associated with pain.  The patient also reports a history of gout, which was triggered by the diabetes medication. They are currently managing their diabetes with metformin.         08/18/2022   10:04 AM 06/29/2022   10:50 AM 05/25/2022   10:29 AM  Depression screen PHQ 2/9  Decreased Interest 0 0 0  Down, Depressed, Hopeless 0 0 0  PHQ - 2 Score 0 0 0   Altered sleeping 0 0   Tired, decreased energy 0 0   Change in appetite 0 0   Feeling bad or failure about yourself  0 0   Trouble concentrating 0 0   Moving slowly or fidgety/restless 0 0   Suicidal thoughts 0 0   PHQ-9 Score 0 0   Difficult doing work/chores Not difficult at all Not difficult at all     Medications: Outpatient Medications Prior to Visit  Medication Sig   Alirocumab (PRALUENT) 150 MG/ML SOAJ Inject into the skin. (Patient not taking: Reported on 05/25/2022)   amLODipine (NORVASC) 5 MG tablet Take 1 tablet by mouth daily.   aspirin 81 MG chewable tablet Chew 81 mg by mouth.   ergocalciferol (VITAMIN D2) 1.25 MG (50000 UT) capsule Take 1 capsule once a week for 8 weeks.   estradiol (ESTRACE) 0.1 MG/GM vaginal cream Estrogen Cream Instruction Discard applicator Apply pea sized amount to tip of finger to urethra before bed. Wash hands well after application. Use Monday, Wednesday and Friday   hydrochlorothiazide (HYDRODIURIL) 25 MG tablet hydrochlorothiazide 25 mg tablet  TAKE 1 TABLET BY MOUTH EVERY DAY   levothyroxine (SYNTHROID) 75 MCG tablet TAKE 1 TABLET BY MOUTH EVERY DAY BEFORE BREAKFAST   meloxicam (MOBIC) 7.5 MG tablet TAKE 1 TABLET BY MOUTH EVERY DAY   metFORMIN (GLUCOPHAGE) 500 MG tablet Take 1 tablet (500 mg total) by mouth daily with breakfast.  metoprolol succinate (TOPROL-XL) 25 MG 24 hr tablet TAKE 1 TABLET (25 MG TOTAL) BY MOUTH DAILY.   nitroGLYCERIN (NITROSTAT) 0.4 MG SL tablet    telmisartan (MICARDIS) 20 MG tablet Take 20 mg by mouth daily.   No facility-administered medications prior to visit.    Review of Systems  All other systems reviewed and are negative. Except see HPI      Objective    BP 129/70 (BP Location: Right Arm, Patient Position: Sitting, Cuff Size: Normal)   Temp 98 F (36.7 C) (Temporal)   Resp 12   Ht 5\' 4"  (1.626 m)   Wt 133 lb (60.3 kg)   SpO2 99%   BMI 22.83 kg/m    Physical Exam Constitutional:      General:  She is not in acute distress.    Appearance: Normal appearance.  HENT:     Head: Normocephalic.  Pulmonary:     Effort: Pulmonary effort is normal. No respiratory distress.  Abdominal:     General: Bowel sounds are normal. There is no distension.     Palpations: Abdomen is soft. There is mass (tender , soft, 1.5 cm + oval shaped RLQ nodule).     Tenderness: There is abdominal tenderness (RLQ). There is no right CVA tenderness, left CVA tenderness, guarding or rebound.     Hernia: No hernia is present.  Skin:    Findings: Rash (umbilical) present.  Neurological:     Mental Status: She is alert and oriented to person, place, and time. Mental status is at baseline.      No results found for any visits on 08/18/22.  Assessment & Plan       Abdominal wall lump Abdominal Pain: Recurrent. Pain in the right lower quadrant, possibly related to a lump. Possibly lipoma. No signs of appendicitis or urinary tract infection. Pain is intermittent and has been present for a few days. -Order an ultrasound of the abdomen to further evaluate the lump and surrounding structures. -[place] needs to be ordered.  Rash Skin Irritation at the Navel: Possibly  yeast infection with weeping and occasional pain. No signs of infection. -Prescribe clotrimazole 1% cream and nystatin powder for topical treatment. -Advise patient to cleanse the area with mild soap and alternate between using the cream and powder. - nystatin (MYCOSTATIN/NYSTOP) powder; Apply 1 Application topically 3 (three) times daily.  Dispense: 15 g; Refill: 0 - fluconazole (DIFLUCAN) 100 MG tablet; Take 1 tablet (100 mg total) by mouth daily.  Dispense: 7 tablet; Refill: 0  In the setting of Type 2 diabetes mellitus with hyperglycemia, without long-term current use of insulin (HCC) Last A1C was 5.7 five months ago. Patient is on metformin 1x daily and has lost significant weight through dietary changes. -Continue current management and monitor A1C  levels at next visit with Dr. Roxan Hockey.   No follow-ups on file.     The patient was advised to call back or seek an in-person evaluation if the symptoms worsen or if the condition fails to improve as anticipated.  I discussed the assessment and treatment plan with the patient. The patient was provided an opportunity to ask questions and all were answered. The patient agreed with the plan and demonstrated an understanding of the instructions.  I, Debera Lat, PA-C have reviewed all documentation for this visit. The documentation on 08/18/22  for the exam, diagnosis, procedures, and orders are all accurate and complete.  Debera Lat, [facility], MMS [facility] [phone] (phone) [phone] (fax)  [facility]

## 2022-08-18 NOTE — Telephone Encounter (Signed)
  Chief Complaint: right flank pain, drainage from naval Symptoms: last seen OV 07/09/22 right flank pain "cyst" noted . Now right side sore to touch, white, thick drainage noted from naval at times. Hx diabetes Frequency: day before yesterday  Pertinent Negatives: Patient denies  fever, no constant pain no N/V no urinary issues  Disposition: [] ED /[] Urgent Care (no appt availability in office) / [x] Appointment(In office/virtual)/ []  [place] [facility]/ [] Home Care/ [] Refused Recommended Disposition /[] [place] [facility]/ []  Follow-up with PCP Additional Notes:   Appt scheduled for 08/19/22 . No available appt with PCP . Patient would rather see PCP if appt available. Please advise .    Reason for Disposition  Diabetes mellitus or weak immune system (e.g., HIV positive, cancer chemo, splenectomy, organ transplant, chronic steroids)  (Exception: Mild pain that is only present with movement.)  Answer Assessment - Initial Assessment Questions 1. LOCATION: "Where does it hurt?" (e.g., left, right)     Right flank  2. ONSET: "When did the pain start?"     Day before yesterday  3. SEVERITY: "How bad is the pain?" (e.g., Scale 1-10; mild, moderate, or severe)   - MILD (1-3): doesn't interfere with normal activities    - MODERATE (4-7): interferes with normal activities or awakens from sleep    - SEVERE (8-10): excruciating pain and patient unable to do normal activities (stays in bed)       Mild pain  4. PATTERN: "Does the pain come and go, or is it constant?"      Comes and goes , random times of pain  5. CAUSE: "What do you think is causing the pain?"     Not sure  6. OTHER SYMPTOMS:  "Do you have any other symptoms?" (e.g., fever, abdomen pain, vomiting, leg weakness, burning with urination, blood in urine)     Right side flank pain , weeping white thick drainage from naval at times. Right side sore to touch. Hx diabetic 7. PREGNANCY:  "Is there any chance you are pregnant?" "When  was your last menstrual period?"     na  Protocols used: Flank Pain-A-AH

## 2022-08-20 NOTE — Progress Notes (Incomplete)
Established patient visit  Patient: Sheila Herrera   DOB: 02/17/1950   72 y.o. Female  MRN: [id number] Visit Date: 08/18/2022  Today's healthcare provider: Debera Lat, PA-C   Chief Complaint  Patient presents with  . Acute Visit   Subjective     HPI   Patient C/O recurrent discharge from [employer] area. This episode started 2 days ago. She reports using baby powder, reports good symptom control. She reports white discharge from [employer] area yesterday. She reports some soreness around [employer] area. Patient c/o pain on right lower abdomen area. She reports that in may shewas told she had a cyst. Last edited by Myles Lipps, CMA on 08/18/2022 10:03 AM.      Discussed the use of AI scribe software for clinical note transcription with the patient, who gave verbal consent to proceed.  History of Present Illness   The patient presents with a weeping navel and abdominal pain. They have a history of prediabetes and have been managing their condition with dietary changes, resulting in a significant weight loss of 39 pounds. They are due to have their A1c levels checked soon. They have been applying baby powder to their navel to manage the weeping.  The patient also reports a tender cyst on their side, which has been causing discomfort for the past couple of days. The cyst is not associated with any changes in size, consistency, or border. The pain is not associated with standing or lifting and is not constant. The patient has had UTIs in the past but does not currently have any urinary symptoms. Bowel movements are regular and not associated with pain.  The patient also reports a history of gout, which was triggered by the diabetes medication. They are currently managing their diabetes with metformin.         08/18/2022   10:04 AM 06/29/2022   10:50 AM 05/25/2022   10:29 AM  Depression screen PHQ 2/9  Decreased Interest 0 0 0  Down, Depressed, Hopeless 0 0 0  PHQ - 2 Score 0 0 0   Altered sleeping 0 0   Tired, decreased energy 0 0   Change in appetite 0 0   Feeling bad or failure about yourself  0 0   Trouble concentrating 0 0   Moving slowly or fidgety/restless 0 0   Suicidal thoughts 0 0   PHQ-9 Score 0 0   Difficult doing work/chores Not difficult at all Not difficult at all     Medications: Outpatient Medications Prior to Visit  Medication Sig  . Alirocumab (PRALUENT) 150 MG/ML SOAJ Inject into the skin. (Patient not taking: Reported on 05/25/2022)  . amLODipine (NORVASC) 5 MG tablet Take 1 tablet by mouth daily.  Marland Kitchen aspirin 81 MG chewable tablet Chew 81 mg by mouth.  . ergocalciferol (VITAMIN D2) 1.25 MG (50000 UT) capsule Take 1 capsule once a week for 8 weeks.  Marland Kitchen estradiol (ESTRACE) 0.1 MG/GM vaginal cream Estrogen Cream Instruction Discard applicator Apply pea sized amount to tip of finger to urethra before bed. Wash hands well after application. Use Monday, Wednesday and Friday  . hydrochlorothiazide (HYDRODIURIL) 25 MG tablet hydrochlorothiazide 25 mg tablet  TAKE 1 TABLET BY MOUTH EVERY DAY  . levothyroxine (SYNTHROID) 75 MCG tablet TAKE 1 TABLET BY MOUTH EVERY DAY BEFORE BREAKFAST  . meloxicam (MOBIC) 7.5 MG tablet TAKE 1 TABLET BY MOUTH EVERY DAY  . metFORMIN (GLUCOPHAGE) 500 MG tablet Take 1 tablet (500 mg total) by mouth daily with breakfast.  .  metoprolol succinate (TOPROL-XL) 25 MG 24 hr tablet TAKE 1 TABLET (25 MG TOTAL) BY MOUTH DAILY.  . nitroGLYCERIN (NITROSTAT) 0.4 MG SL tablet   . telmisartan (MICARDIS) 20 MG tablet Take 20 mg by mouth daily.   No facility-administered medications prior to visit.    Review of Systems  All other systems reviewed and are negative. Except see HPI   {Labs  Heme  Chem  Endocrine  Serology  Results Review (optional):23779}   Objective    BP 129/70 (BP Location: Right Arm, Patient Position: Sitting, Cuff Size: Normal)   Temp 98 F (36.7 C) (Temporal)   Resp 12   Ht 5\' 4"  (1.626 m)   Wt 133 lb  (60.3 kg)   SpO2 99%   BMI 22.83 kg/m  {Show previous vital signs (optional):23777}  Physical Exam Constitutional:      General: She is not in acute distress.    Appearance: Normal appearance.  HENT:     Head: Normocephalic.  Pulmonary:     Effort: Pulmonary effort is normal. No respiratory distress.  Abdominal:     General: Bowel sounds are normal. There is no distension.     Palpations: Abdomen is soft. There is mass (tender , a few cm).     Tenderness: There is abdominal tenderness (RLQ). There is no right CVA tenderness, left CVA tenderness, guarding or rebound.     Hernia: No hernia is present.  Skin:    Findings: Rash (umbilical) present.  Neurological:     Mental Status: She is alert and oriented to person, place, and time. Mental status is at baseline.      No results found for any visits on 08/18/22.  Assessment & Plan       Abdominal wall lump Abdominal Pain: Recurrent. Pain in the right lower quadrant, possibly related to a lump. Possibly lipoma. No signs of appendicitis or urinary tract infection. Pain is intermittent and has been present for a few days. -Order an ultrasound of the abdomen to further evaluate the lump and surrounding structures. -[place] needs to be ordered.  Rash Skin Irritation at the Navel: Possibly  yeast infection with weeping and occasional pain. No signs of infection. -Prescribe clotrimazole 1% cream and nystatin powder for topical treatment. -Advise patient to cleanse the area with mild soap and alternate between using the cream and powder. - nystatin (MYCOSTATIN/NYSTOP) powder; Apply 1 Application topically 3 (three) times daily.  Dispense: 15 g; Refill: 0 - fluconazole (DIFLUCAN) 100 MG tablet; Take 1 tablet (100 mg total) by mouth daily.  Dispense: 7 tablet; Refill: 0  Type 2 diabetes mellitus with hyperglycemia, without long-term current use of insulin (HCC) Last A1C was 5.7 five months ago. Patient is on metformin 1x daily and has lost  significant weight through dietary changes. -Continue current management and monitor A1C levels at next visit with Dr. Roxan Hockey.   No follow-ups on file.     The patient was advised to call back or seek an in-person evaluation if the symptoms worsen or if the condition fails to improve as anticipated.  I discussed the assessment and treatment plan with the patient. The patient was provided an opportunity to ask questions and all were answered. The patient agreed with the plan and demonstrated an understanding of the instructions.  I, Debera Lat, PA-C have reviewed all documentation for this visit. The documentation on 08/18/22  for the exam, diagnosis, procedures, and orders are all accurate and complete.  Debera Lat, PAC, MMS [facility] [phone] (phone) [phone] (fax)  [facility]

## 2022-08-21 ENCOUNTER — Emergency Department
Admission: EM | Admit: 2022-08-21 | Discharge: 2022-08-21 | Disposition: A | Discharge status: Home / Self Care | Payer: Medicare HMO | Attending: Emergency Medicine | Admitting: Emergency Medicine

## 2022-08-21 ENCOUNTER — Emergency Department: Payer: Medicare HMO

## 2022-08-21 ENCOUNTER — Other Ambulatory Visit: Payer: Self-pay

## 2022-08-21 DIAGNOSIS — I2510 Atherosclerotic heart disease of native coronary artery without angina pectoris: Secondary | ICD-10-CM | POA: Diagnosis not present

## 2022-08-21 DIAGNOSIS — K5730 Diverticulosis of large intestine without perforation or abscess without bleeding: Secondary | ICD-10-CM | POA: Diagnosis not present

## 2022-08-21 DIAGNOSIS — E119 Type 2 diabetes mellitus without complications: Secondary | ICD-10-CM | POA: Insufficient documentation

## 2022-08-21 DIAGNOSIS — R1031 Right lower quadrant pain: Secondary | ICD-10-CM | POA: Diagnosis not present

## 2022-08-21 DIAGNOSIS — I10 Essential (primary) hypertension: Secondary | ICD-10-CM | POA: Insufficient documentation

## 2022-08-21 DIAGNOSIS — K449 Diaphragmatic hernia without obstruction or gangrene: Secondary | ICD-10-CM | POA: Diagnosis not present

## 2022-08-21 LAB — COMPREHENSIVE METABOLIC PANEL
ALT: 14 U/L (ref 0–44)
AST: 17 U/L (ref 15–41)
Albumin: 4.3 g/dL (ref 3.5–5.0)
Alkaline Phosphatase: 45 U/L (ref 38–126)
Anion gap: 10 (ref 5–15)
BUN: 23 mg/dL (ref 8–23)
CO2: 27 mmol/L (ref 22–32)
Calcium: 9.7 mg/dL (ref 8.9–10.3)
Chloride: 100 mmol/L (ref 98–111)
Creatinine, Ser: 0.93 mg/dL (ref 0.44–1.00)
GFR, Estimated: >60 mL/min (ref >60)
Glucose, Bld: 105 mg/dL — ABNORMAL HIGH (ref 70–99)
Potassium: 3.9 mmol/L (ref 3.5–5.1)
Sodium: 137 mmol/L (ref 135–145)
Total Bilirubin: 1 mg/dL (ref 0.3–1.2)
Total Protein: 7.7 g/dL (ref 6.5–8.1)

## 2022-08-21 LAB — CBC
HCT: 46.5 % — ABNORMAL HIGH (ref 36.0–46.0)
Hemoglobin: 15.9 g/dL — ABNORMAL HIGH (ref 12.0–15.0)
MCH: 31.7 pg (ref 26.0–34.0)
MCHC: 34.2 g/dL (ref 30.0–36.0)
MCV: 92.6 fL (ref 80.0–100.0)
Platelets: 178 10*3/uL (ref 150–400)
RBC: 5.02 MIL/uL (ref 3.87–5.11)
RDW: 12.3 % (ref 11.5–15.5)
WBC: 3.4 10*3/uL — ABNORMAL LOW (ref 4.0–10.5)
nRBC: 0 % (ref 0.0–0.2)

## 2022-08-21 LAB — LIPASE, BLOOD: Lipase: 36 U/L (ref 11–51)

## 2022-08-21 MED ORDER — IOHEXOL 300 MG/ML  SOLN
100.0000 mL | Freq: Once | INTRAMUSCULAR | Status: AC | PRN
  Administered 2022-08-21: 100 mL via INTRAVENOUS

## 2022-08-21 NOTE — Discharge Instructions (Signed)
Follow-up with your regular provider.  Return to the ER for new, worsening, or persistent severe abdominal pain, vomiting, fever, or any other new or worsening symptoms that concern you.

## 2022-08-21 NOTE — ED Provider Notes (Signed)
[facility] Provider Note    Event Date/Time   First MD Initiated Contact with Patient 08/21/22 1143     (approximate)   History   Groin Pain (R)   HPI  Sheila Herrera is a 72 y.o. female with a history of diabetes, gout, CAD, hypertension, and hyperlipidemia who presents with right lower quadrant pain for the last several months intermittently.  It is around the area of scar from remote surgery when she was a child.  She states that she feels it is like a cyst there.  The patient denies any associated nausea or vomiting, diarrhea or change in her bowel movements, urinary symptoms, or vaginal bleeding.  She was seen as an outpatient and is scheduled for an ultrasound in a few days, but states the pain got worse.  However, there are times when she does not feel it at all.  I reviewed the past medical records per the patient most recent outpatient counter was with [facility] family practice on 7/10.  At that time the patient reported this pain but also discharge from her navel.  She was prescribed zinc oxide and states that it has been working.   Physical Exam   Triage Vital Signs: ED Triage Vitals  Encounter Vitals Group     BP 08/21/22 1005 (!) 156/81     Systolic BP Percentile --      Diastolic BP Percentile --      Pulse Rate 08/21/22 1005 71     Resp 08/21/22 1005 16     Temp 08/21/22 1005 98.2 F (36.8 C)     Temp Source 08/21/22 1005 Oral     SpO2 08/21/22 1005 98 %     Weight 08/21/22 1006 133 lb (60.3 kg)     Height 08/21/22 1006 5\' 4"  (1.626 m)     Head Circumference --      Peak Flow --      Pain Score 08/21/22 1006 5     Pain Loc --      Pain Education --      Exclude from Growth Chart --     Most recent vital signs: Vitals:   08/21/22 1005  BP: (!) 156/81  Pulse: 71  Resp: 16  Temp: 98.2 F (36.8 C)  SpO2: 98%     General: Awake, no distress.  CV:  Good peripheral perfusion.  Resp:  Normal effort.  Abd:  Soft with  mild right lower quadrant tenderness.  No palpable mass.  No distention.  Other:  No jaundice or scleral icterus.   ED Results / Procedures / Treatments   Labs (all labs ordered are listed, but only abnormal results are displayed) Labs Reviewed  COMPREHENSIVE METABOLIC PANEL - Abnormal; Notable for the following components:      Result Value   Glucose, Bld 105 (*)    All other components within normal limits  CBC - Abnormal; Notable for the following components:   WBC 3.4 (*)    Hemoglobin 15.9 (*)    HCT 46.5 (*)    All other components within normal limits  LIPASE, BLOOD     EKG     RADIOLOGY  CT abdomen/pelvis: I independently viewed and interpreted the images; there are no dilated bowel loops or any free air or free fluid.  IMPRESSION:  No evidence of appendicitis or other acute findings.    Colonic diverticulosis, without radiographic evidence of  diverticulitis.    Moderate to large hiatal hernia.  PROCEDURES:  Critical Care performed: No  Procedures   MEDICATIONS ORDERED IN ED: Medications  iohexol (OMNIPAQUE) 300 MG/ML solution 100 mL (100 mLs Intravenous Contrast Given 08/21/22 1232)     IMPRESSION / MDM / ASSESSMENT AND PLAN / ED COURSE  I reviewed the triage vital signs and the nursing notes.  72 year old female with PMH as noted above presents with right lower abdominal pain which has been present intermittently for months but has acutely worsened over the last several days.  Differential diagnosis includes, but is not limited to, diverticulitis, colitis, this, ovarian cyst, small bowel obstruction, volvulus, malignancy.  We will obtain lab workup and CT for further evaluation.  Patient's presentation is most consistent with acute complicated illness / injury requiring diagnostic workup.  ----------------------------------------- 2:33 PM on 08/21/2022 -----------------------------------------  Lab workup is unremarkable.  There is no  leukocytosis.  LFTs and lipase are normal.  CT shows no acute findings.  The patient appears very reassured.  She is not having any urinary symptoms and declines to wait for urinalysis which I think is reasonable.  She will follow-up with her primary care provider.  I gave strict return precautions and she expressed understanding.   FINAL CLINICAL IMPRESSION(S) / ED DIAGNOSES   Final diagnoses:  Right lower quadrant pain     Rx / DC Orders   ED Discharge Orders     None        Note:  This document was prepared using Dragon voice recognition software and may include unintentional dictation errors.    Dionne Bucy, MD 08/21/22 1433

## 2022-08-21 NOTE — ED Triage Notes (Signed)
Pt to ED for R groin pain/RLQ area since 2 months ago. Pt was told may be a cyst. States needs imaging. States pain comes and goes. Denies flank pain and urinary symptoms. RLQ lower abdomen appears larger than L. Pt states "it feels like a knot". Pain is intermittent and between dull/aching/cramping/sharp. Has appendix and GB.  States has scar to RLQ from vein graft for cardiac surgery at age 72 and that pain is around this scar.

## 2022-08-23 ENCOUNTER — Ambulatory Visit: Payer: Medicare HMO

## 2022-08-26 ENCOUNTER — Other Ambulatory Visit: Payer: Self-pay | Admitting: Family Medicine

## 2022-08-27 ENCOUNTER — Telehealth: Payer: Self-pay

## 2022-08-27 NOTE — Telephone Encounter (Signed)
Requested medications are due for refill today.  yes  Requested medications are on the active medications list.  yes  Last refill. 08/05/2022 #30 0 rf  Future visit scheduled.   yes  Notes to clinic.  Labs are expired.    Requested Prescriptions  Pending Prescriptions Disp Refills   levothyroxine (SYNTHROID) 75 MCG tablet [Pharmacy Med Name: LEVOTHYROXINE 75 MCG TABLET] 30 tablet 0    Sig: TAKE 1 TABLET BY MOUTH EVERY DAY BEFORE BREAKFAST     Endocrinology:  Hypothyroid Agents Failed - 08/26/2022 11:07 AM      Failed - TSH in normal range and within 360 days    TSH  Date Value Ref Range Status  08/04/2021 8.650 (H) 0.450 - 4.500 uIU/mL Final         Passed - Valid encounter within last 12 months    Recent Outpatient Visits           1 week ago Rash   [place] [facility] [place], [place], PA-C   1 month ago Lipoma of abdominal wall   [facility] Merita Norton T, FNP   5 months ago Type 2 diabetes mellitus with hyperglycemia, without long-term current use of insulin (HCC)   [facility] Simmons-Robinson, [place], MD   6 months ago Type 2 diabetes mellitus with hyperglycemia, without long-term current use of insulin [facility])   [facility] Simmons-Robinson, [place], MD   7 months ago [place] toe pain, right   [facility] Alfredia Ferguson, PA-C       Future Appointments             In 1 week Simmons-Robinson, Tawanna Cooler, MD [facility]

## 2022-08-27 NOTE — Telephone Encounter (Signed)
Transition Care Management Follow-up Telephone Call Date of discharge and from where: 08/21/2022 [facility] How have you been since you were released from the hospital? Patient is feeling much better. Any questions or concerns? No  Items Reviewed: Did the pt receive and understand the discharge instructions provided? Yes  Medications obtained and verified?  No medication prescribed. Other? No  Any new allergies since your discharge? No  Dietary orders reviewed? Yes Do you have support at home? Yes   Follow up appointments reviewed:  PCP Hospital f/u appt confirmed? Yes  Scheduled to see Ronnald Ramp, MD on 09/03/2022 @ [facility]. Specialist Hospital f/u appt confirmed? No  Scheduled to see  on  @ . Are transportation arrangements needed? No  If their condition worsens, is the pt aware to call PCP or go to the Emergency Dept.? Yes Was the patient provided with contact information for the PCP's office or ED? Yes Was to pt encouraged to call back with questions or concerns? Yes  Jahlon Baines [facility]  [facility] Population Health Community Resource Care Guide   [email or web address]  ?? [phone]   Website: [email or web address]  [email or web address]

## 2022-09-03 ENCOUNTER — Ambulatory Visit: Payer: Medicare HMO | Admitting: Family Medicine

## 2022-09-09 DIAGNOSIS — J060 Acute laryngopharyngitis: Secondary | ICD-10-CM | POA: Diagnosis not present

## 2022-09-09 DIAGNOSIS — H6692 Otitis media, unspecified, left ear: Secondary | ICD-10-CM | POA: Diagnosis not present

## 2022-09-10 NOTE — Progress Notes (Addendum)
Established patient visit   Patient: Sheila Herrera   DOB: 10/04/1950   72 y.o. Female  MRN: [id number] Visit Date: 09/15/2022  Today's healthcare provider: Ronnald Ramp, MD   Chief Complaint  Patient presents with  . Medical Management of Chronic Issues   Subjective     HPI   DM-she is not checking blood sugars at home. She reports good comliance and tolerance with metformin.  Patient concerned about possible vaginal prolapse.  Vitamin B 12 injection due today. Last B12 result was on 06/11/21 226. Last edited by Myles Lipps, CMA on 09/15/2022  1:42 PM.      DM Metformin 500mg  daily  Lab Results  Component Value Date   HGBA1C 5.8 (A) 09/15/2022   Discussed the use of AI scribe software for clinical note transcription with the patient, who gave verbal consent to proceed.  History of Present Illness   The patient, with a history of diabetes, presents with concerns about a recent increase in their HbA1c from 5.7 to 5.8. She has been adhering to a low-carb diet, including low-carb popsicles, and has lost approximately 30 pounds since December. The patient is currently on metformin but expresses uncertainty about its continued use due to confusion about when to take it. She has suggested reducing the frequency of metformin intake to every other day or every three days.  The patient also reports a sensation of a bulge in her vagina, which she suspects may be a prolapse. This sensation is present most of the time and occasionally recedes. She denies any urinary symptoms associated with this sensation. The patient has had two vaginal deliveries in the past. She recently experienced side pain, which prompted a CT scan. The scan did not reveal any abnormalities, and the pain has since resolved. The patient has expressed interest in exploring treatment options for the suspected prolapse.        Medications: Outpatient Medications Prior to Visit  Medication  Sig  . Alirocumab (PRALUENT) 150 MG/ML SOAJ Inject into the skin.  Marland Kitchen amLODipine (NORVASC) 5 MG tablet Take 1 tablet by mouth daily.  Marland Kitchen aspirin 81 MG chewable tablet Chew 81 mg by mouth.  . ergocalciferol (VITAMIN D2) 1.25 MG (50000 UT) capsule Take 1 capsule once a week for 8 weeks.  Marland Kitchen estradiol (ESTRACE) 0.1 MG/GM vaginal cream Estrogen Cream Instruction Discard applicator Apply pea sized amount to tip of finger to urethra before bed. Wash hands well after application. Use Monday, Wednesday and Friday  . fluconazole (DIFLUCAN) 100 MG tablet Take 1 tablet (100 mg total) by mouth daily.  . hydrochlorothiazide (HYDRODIURIL) 25 MG tablet hydrochlorothiazide 25 mg tablet  TAKE 1 TABLET BY MOUTH EVERY DAY  . levothyroxine (SYNTHROID) 75 MCG tablet TAKE 1 TABLET BY MOUTH EVERY DAY BEFORE BREAKFAST  . meloxicam (MOBIC) 7.5 MG tablet TAKE 1 TABLET BY MOUTH EVERY DAY  . metFORMIN (GLUCOPHAGE) 500 MG tablet Take 1 tablet (500 mg total) by mouth daily with breakfast.  . metoprolol succinate (TOPROL-XL) 25 MG 24 hr tablet TAKE 1 TABLET (25 MG TOTAL) BY MOUTH DAILY.  . nitroGLYCERIN (NITROSTAT) 0.4 MG SL tablet   . nystatin (MYCOSTATIN/NYSTOP) powder Apply 1 Application topically 3 (three) times daily.  Marland Kitchen telmisartan (MICARDIS) 20 MG tablet Take 20 mg by mouth daily.   No facility-administered medications prior to visit.    Review of Systems       Objective    BP 125/79 (BP Location: Right Arm, Patient Position:  Sitting, Cuff Size: Normal)   Pulse 67   Temp 97.9 F (36.6 C) (Temporal)   Resp 12   Ht 5\' 4"  (1.626 m)   Wt 133 lb 9.6 oz (60.6 kg)   SpO2 99%   BMI 22.93 kg/m      Physical Exam Vitals reviewed.  Constitutional:      General: She is not in acute distress.    Appearance: Normal appearance. She is not ill-appearing, toxic-appearing or diaphoretic.  Eyes:     Conjunctiva/sclera: Conjunctivae normal.  Cardiovascular:     Rate and Rhythm: Normal rate and regular rhythm.      Pulses: Normal pulses.     Heart sounds: Normal heart sounds. No murmur heard.    No friction rub. No gallop.  Pulmonary:     Effort: Pulmonary effort is normal. No respiratory distress.     Breath sounds: Normal breath sounds. No stridor. No wheezing, rhonchi or rales.  Abdominal:     General: Bowel sounds are normal. There is no distension.     Palpations: Abdomen is soft.     Tenderness: There is no abdominal tenderness.  Genitourinary:    Labia:        Right: No rash.      Vagina: Prolapsed vaginal walls present.  Musculoskeletal:     Right lower leg: No edema.     Left lower leg: No edema.  Skin:    Findings: No erythema or rash.  Neurological:     Mental Status: She is alert and oriented to person, place, and time.      Results for orders placed or performed in visit on 09/15/22  Microalbumin / creatinine urine ratio  Result Value Ref Range   Creatinine, Urine 107.2 Not Estab. mg/dL   Microalbumin, Urine 9.9 Not Estab. ug/mL   Microalb/Creat Ratio 9 0 - 29 mg/g creat  POCT glycosylated hemoglobin (Hb A1C)  Result Value Ref Range   Hemoglobin A1C 5.8 (A) 4.0 - 5.6 %    Assessment & Plan     Problem List Items Addressed This Visit     B12 deficiency   HTN (hypertension)   Type 2 diabetes mellitus with hyperglycemia, without long-term current use of insulin (HCC)   Relevant Orders   POCT glycosylated hemoglobin (Hb A1C) (Completed)   Microalbumin / creatinine urine ratio (Completed)   Vaginal prolapse - Primary   Relevant Orders   Ambulatory referral to Gynecology       Prediabetes A1c increased from 5.7 to 5.8. Patient has lost significant weight and is maintaining a low carb diet. Discussed the possibility of discontinuing Metformin, but patient expressed concern and preferred to continue medication on an intermittent basis. -Continue Metformin on an every other day or every three days schedule, as per patient's comfort. -Continue low carb diet and exercise  regimen.  Possible Vaginal Prolapse Patient reports feeling a bulge and discomfort, but no urinary symptoms. History of two vaginal deliveries. -Perform physical examination to assess for prolapse. -Refer to gynecology for further evaluation and management options, including possible pessary or surgical intervention.  Weight Management Significant weight loss achieved through diet and exercise. BMI now within normal range. -Continue current diet and exercise regimen. -Encourage maintenance of healthy weight.        Return in about 6 months (around 03/18/2023) for DM.         Ronnald Ramp, MD  [facility] [phone] (phone) [phone] (fax)  [facility]

## 2022-09-15 ENCOUNTER — Encounter: Payer: Self-pay | Admitting: Family Medicine

## 2022-09-15 ENCOUNTER — Ambulatory Visit (INDEPENDENT_AMBULATORY_CARE_PROVIDER_SITE_OTHER): Payer: Medicare HMO | Admitting: Family Medicine

## 2022-09-15 VITALS — BP 125/79 | HR 67 | Temp 97.9°F | Resp 12 | Ht 64.0 in | Wt 133.6 lb

## 2022-09-15 DIAGNOSIS — I10 Essential (primary) hypertension: Secondary | ICD-10-CM

## 2022-09-15 DIAGNOSIS — I2510 Atherosclerotic heart disease of native coronary artery without angina pectoris: Secondary | ICD-10-CM

## 2022-09-15 DIAGNOSIS — N8110 Cystocele, unspecified: Secondary | ICD-10-CM

## 2022-09-15 DIAGNOSIS — E1165 Type 2 diabetes mellitus with hyperglycemia: Secondary | ICD-10-CM | POA: Diagnosis not present

## 2022-09-15 DIAGNOSIS — E538 Deficiency of other specified B group vitamins: Secondary | ICD-10-CM | POA: Insufficient documentation

## 2022-09-15 DIAGNOSIS — Z7984 Long term (current) use of oral hypoglycemic drugs: Secondary | ICD-10-CM

## 2022-09-15 DIAGNOSIS — Z1159 Encounter for screening for other viral diseases: Secondary | ICD-10-CM

## 2022-09-15 DIAGNOSIS — E038 Other specified hypothyroidism: Secondary | ICD-10-CM

## 2022-09-15 LAB — POCT GLYCOSYLATED HEMOGLOBIN (HGB A1C): Hemoglobin A1C: 5.8 % — AB (ref 4.0–5.6)

## 2022-09-15 MED ORDER — CYANOCOBALAMIN 1000 MCG/ML IJ SOLN
1000.0000 ug | Freq: Once | INTRAMUSCULAR | Status: AC
Start: 2022-09-15 — End: 2022-09-15
  Administered 2022-09-15: 1000 ug via INTRAMUSCULAR

## 2022-09-15 NOTE — Patient Instructions (Addendum)
[facility]   I have placed a referral in case you need this to schedule an appointment   [address], [place], [place] [address] Phone: [phone]

## 2022-09-20 NOTE — Addendum Note (Signed)
Addended by: Bing Neighbors on: 09/20/2022 03:31 PM   Modules accepted: Level of Service

## 2022-09-24 ENCOUNTER — Other Ambulatory Visit: Payer: Self-pay | Admitting: Family Medicine

## 2022-10-06 ENCOUNTER — Ambulatory Visit (INDEPENDENT_AMBULATORY_CARE_PROVIDER_SITE_OTHER): Payer: Medicare HMO | Admitting: Obstetrics and Gynecology

## 2022-10-06 ENCOUNTER — Encounter: Payer: Self-pay | Admitting: Obstetrics and Gynecology

## 2022-10-06 VITALS — BP 106/69 | HR 55 | Ht 64.0 in | Wt 132.9 lb

## 2022-10-06 DIAGNOSIS — N8110 Cystocele, unspecified: Secondary | ICD-10-CM

## 2022-10-06 DIAGNOSIS — N8189 Other female genital prolapse: Secondary | ICD-10-CM | POA: Diagnosis not present

## 2022-10-06 NOTE — Progress Notes (Signed)
HPI:      [name] is a 72 y.o. [id number] who LMP was No LMP recorded. Patient is postmenopausal.  Subjective:   She presents today stating that after lifting something into the car she felt a difference in her vaginal area.  She had a bulge.  She saw her primary care physician about this and was diagnosed with a prolapse.  She is not having any urinary symptoms or bowel symptoms.  She does note occasional pelvic pressure.  She is sexually active.    Hx: The following portions of the patient's history were reviewed and updated as appropriate:             She  has a past medical history of Hypertension. She does not have any pertinent problems on file. She  has a past surgical history that includes Cardiac surgery; Vein Surgery; and Coronary stent placement. Her family history includes Arthritis in her mother; Cancer in her father; Heart disease in her brother and sister; OCD in her son. She  reports that she has never smoked. She has never used smokeless tobacco. She reports that she does not drink alcohol and does not use drugs. She has a current medication list which includes the following prescription(s): praluent, amlodipine, aspirin, ergocalciferol, estradiol, fluconazole, hydrochlorothiazide, levothyroxine, meloxicam, metformin, metoprolol succinate, nitroglycerin, nystatin, and telmisartan. She has No Known Allergies.       Review of Systems:  Review of Systems  Constitutional: Denied constitutional symptoms, night sweats, recent illness, fatigue, fever, insomnia and weight loss.  Eyes: Denied eye symptoms, eye pain, photophobia, vision change and visual disturbance.  Ears/Nose/Throat/Neck: Denied ear, nose, throat or neck symptoms, hearing loss, nasal discharge, sinus congestion and sore throat.  Cardiovascular: Denied cardiovascular symptoms, arrhythmia, chest pain/pressure, edema, exercise intolerance, orthopnea and palpitations.  Respiratory: Denied pulmonary symptoms,  asthma, pleuritic pain, productive sputum, cough, dyspnea and wheezing.  Gastrointestinal: Denied, gastro-esophageal reflux, melena, nausea and vomiting.  Genitourinary: See HPI for additional information.  Musculoskeletal: Denied musculoskeletal symptoms, stiffness, swelling, muscle weakness and myalgia.  Dermatologic: Denied dermatology symptoms, rash and scar.  Neurologic: Denied neurology symptoms, dizziness, headache, neck pain and syncope.  Psychiatric: Denied psychiatric symptoms, anxiety and depression.  Endocrine: Denied endocrine symptoms including hot flashes and night sweats.   Meds:   Current Outpatient Medications on File Prior to Visit  Medication Sig Dispense Refill   Alirocumab (PRALUENT) 150 MG/ML SOAJ Inject into the skin.     amLODipine (NORVASC) 5 MG tablet Take 1 tablet by mouth daily.     aspirin 81 MG chewable tablet Chew 81 mg by mouth.     ergocalciferol (VITAMIN D2) 1.25 MG (50000 UT) capsule Take 1 capsule once a week for 8 weeks.     estradiol (ESTRACE) 0.1 MG/GM vaginal cream Estrogen Cream Instruction Discard applicator Apply pea sized amount to tip of finger to urethra before bed. Wash hands well after application. Use Monday, Wednesday and Friday 42.5 g 12   fluconazole (DIFLUCAN) 100 MG tablet Take 1 tablet (100 mg total) by mouth daily. 7 tablet 0   hydrochlorothiazide (HYDRODIURIL) 25 MG tablet hydrochlorothiazide 25 mg tablet  TAKE 1 TABLET BY MOUTH EVERY DAY     levothyroxine (SYNTHROID) 75 MCG tablet TAKE 1 TABLET BY MOUTH EVERY DAY BEFORE BREAKFAST 90 tablet 0   meloxicam (MOBIC) 7.5 MG tablet TAKE 1 TABLET BY MOUTH EVERY DAY 60 tablet 0   metFORMIN (GLUCOPHAGE) 500 MG tablet Take 1 tablet (500 mg total) by mouth daily  with breakfast. 180 tablet 3   metoprolol succinate (TOPROL-XL) 25 MG 24 hr tablet TAKE 1 TABLET (25 MG TOTAL) BY MOUTH DAILY. 90 tablet 3   nitroGLYCERIN (NITROSTAT) 0.4 MG SL tablet      nystatin (MYCOSTATIN/NYSTOP) powder Apply 1  Application topically 3 (three) times daily. 15 g 0   telmisartan (MICARDIS) 20 MG tablet Take 20 mg by mouth daily.     No current facility-administered medications on file prior to visit.      Objective:     Vitals:   10/06/22 1040  BP: 106/69  Pulse: (!) 55   Filed Weights   10/06/22 1040  Weight: 132 lb 14.4 oz (60.3 kg)                        Assessment:    [id number] Patient Active Problem List   Diagnosis Date Noted   Vaginal prolapse 09/15/2022   B12 deficiency 09/15/2022   Postmenopausal 06/29/2022   Type 2 diabetes mellitus with hyperglycemia, without long-term current use of insulin (HCC) 11/28/2021   Statin myopathy 11/25/2021   Vulvovaginal dryness 02/12/2021   Coronary artery disease involving native coronary artery 07/24/2019   HTN (hypertension) 07/24/2019   Lumbar radiculopathy 04/10/2019   NSTEMI (non-ST elevated myocardial infarction) (HCC) 01/20/2016   History of recurrent UTI (urinary tract infection) 05/14/2015   Hypothyroidism 05/14/2015     1. Pelvic relaxation disorder        Plan:            1.  We discussed pelvic relaxation in detail.  Cystocele rectocele and uterine prolapse discussed.  Issues with urinary incontinence as well as difficulty with bowel movements discussed.  Patient currently not symptomatic other than pressure feeling and a bulge.  Management including expectant management, pelvic floor therapy, pessary, and hysterectomy with A&P repair discussed in detail.  Patient not interested in any management at this time.  She will contact us if her symptoms change or if she changes her mind. Orders No orders of the defined types were placed in this encounter.   No orders of the defined types were placed in this encounter.     F/U  No follow-ups on file. I spent 31 minutes involved in the care of this patient preparing to see the patient by obtaining and reviewing her medical history (including labs, imaging tests and prior  procedures), documenting clinical information in the electronic health record (EHR), counseling and coordinating care plans, writing and sending prescriptions, ordering tests or procedures and in direct communicating with the patient and medical staff discussing pertinent items from her history and physical exam.  Elonda Husky, M.D. 10/06/2022 11:13 AM

## 2022-10-06 NOTE — Progress Notes (Signed)
Patient presents to discuss prolapse. She states after lifting something into her car she went something drop and then a bulge, this was 3-4 weeks ago. She denies incontinence or pain at this time, reports discomfort. No additional concerns.

## 2022-10-13 ENCOUNTER — Ambulatory Visit (INDEPENDENT_AMBULATORY_CARE_PROVIDER_SITE_OTHER): Payer: Medicare HMO

## 2022-10-13 DIAGNOSIS — E538 Deficiency of other specified B group vitamins: Secondary | ICD-10-CM

## 2022-10-13 MED ORDER — CYANOCOBALAMIN 1000 MCG/ML IJ SOLN
1000.0000 ug | Freq: Once | INTRAMUSCULAR | Status: AC
Start: 2022-10-13 — End: 2022-10-13
  Administered 2022-10-13: 1000 ug via INTRAMUSCULAR

## 2022-10-29 ENCOUNTER — Other Ambulatory Visit: Payer: Self-pay | Admitting: Family Medicine

## 2022-11-01 NOTE — Telephone Encounter (Signed)
Requested medications are due for refill today.  unsure  Requested medications are on the active medications list.  yes  Last refill. 12/28/2021 #180 3 rf  Future visit scheduled.   yes  Notes to clinic.  Rx refill request has pt taking 2 tablets daily. Med list has pt taking 1 tablet daily. Please advise.    Requested Prescriptions  Pending Prescriptions Disp Refills   metFORMIN (GLUCOPHAGE) 500 MG tablet [Pharmacy Med Name: METFORMIN HCL 500 MG TABLET] 180 tablet 3    Sig: TAKE 1 TABLET BY MOUTH 2 TIMES DAILY WITH A MEAL.     Endocrinology:  Diabetes - Biguanides Failed - 10/29/2022  1:37 AM      Failed - B12 Level in normal range and within 720 days    No results found for: "VITAMINB12"       Failed - CBC within normal limits and completed in the last 12 months    WBC  Date Value Ref Range Status  08/21/2022 3.4 (L) 4.0 - 10.5 K/uL Final   RBC  Date Value Ref Range Status  08/21/2022 5.02 3.87 - 5.11 MIL/uL Final   Hemoglobin  Date Value Ref Range Status  08/21/2022 15.9 (H) 12.0 - 15.0 g/dL Final  16/11/9602 54.0 11.1 - 15.9 g/dL Final   HCT  Date Value Ref Range Status  08/21/2022 46.5 (H) 36.0 - 46.0 % Final   Hematocrit  Date Value Ref Range Status  08/04/2021 44.2 34.0 - 46.6 % Final   MCHC  Date Value Ref Range Status  08/21/2022 34.2 30.0 - 36.0 g/dL Final   [facility]  Date Value Ref Range Status  08/21/2022 31.7 26.0 - 34.0 pg Final   MCV  Date Value Ref Range Status  08/21/2022 92.6 80.0 - 100.0 fL Final  08/04/2021 92 79 - 97 fL Final  06/07/2014 94 80 - 100 fL Final   No results found for: "PLTCOUNTKUC", "LABPLAT", "POCPLA" RDW  Date Value Ref Range Status  08/21/2022 12.3 11.5 - 15.5 % Final  08/04/2021 14.5 11.7 - 15.4 % Final  06/07/2014 13.4 11.5 - 14.5 % Final         Passed - Cr in normal range and within 360 days    Creatinine  Date Value Ref Range Status  06/07/2014 0.71 mg/dL Final    Comment:    9.81-1.91 NOTE: New Reference  Range  04/16/14    Creatinine, Ser  Date Value Ref Range Status  08/21/2022 0.93 0.44 - 1.00 mg/dL Final         Passed - HBA1C is between 0 and 7.9 and within 180 days    Hemoglobin A1C  Date Value Ref Range Status  09/15/2022 5.8 (A) 4.0 - 5.6 % Final   Hgb A1c MFr Bld  Date Value Ref Range Status  11/17/2021 6.9 (H) 4.8 - 5.6 % Final    Comment:             Prediabetes: 5.7 - 6.4          Diabetes: >6.4          Glycemic control for adults with diabetes: <7.0          Passed - eGFR in normal range and within 360 days    EGFR (African American)  Date Value Ref Range Status  06/07/2014 >60  Final   GFR calc Af Amer  Date Value Ref Range Status  02/05/2016 88 >59 mL/min/1.73 Final   EGFR (Non-African Amer.)  Date Value  Ref Range Status  06/07/2014 >60  Final    Comment:    eGFR values <52mL/min/1.73 m2 may be an indication of chronic kidney disease (CKD). Calculated eGFR is useful in patients with stable renal function. The eGFR calculation will not be reliable in acutely ill patients when serum creatinine is changing rapidly. It is not useful in patients on dialysis. The eGFR calculation may not be applicable to patients at the low and high extremes of body sizes, pregnant women, and vegetarians.    GFR, Estimated  Date Value Ref Range Status  08/21/2022 >60 >60 mL/min Final    Comment:    (NOTE) Calculated using the CKD-EPI Creatinine Equation (2021)    eGFR  Date Value Ref Range Status  11/17/2021 65 >59 mL/min/1.73 Final         Passed - Valid encounter within last 6 months    Recent Outpatient Visits           1 month ago Vaginal prolapse   [facility] O[place]llo, [place], MD   2 months ago Rash   [facility] Cameron, [place], PA-C   4 months ago Lipoma of abdominal wall   [facility] Merita Norton T, FNP   7 months ago Type 2 diabetes mellitus with  hyperglycemia, without long-term current use of insulin (HCC)   [place] [facility] [facility] Simmons-Robinson, [place], MD   8 months ago Type 2 diabetes mellitus with hyperglycemia, without long-term current use of insulin (HCC)   [facility] Simmons-Robinson, Tawanna Cooler, MD       Future Appointments             In 4 months Simmons-Robinson, Tawanna Cooler, MD [facility], [facility]

## 2022-11-08 ENCOUNTER — Telehealth: Payer: Self-pay

## 2022-11-08 NOTE — Telephone Encounter (Signed)
I have pended the Colonoscopy referral. Not sure why it wasn't ordered by the [facility] but on her note it says to discuss with PCP. Sending to be review if needed.

## 2022-11-08 NOTE — Telephone Encounter (Signed)
Copied from CRM [id number]. Topic: Referral - Request for Referral >> Nov 05, 2022  3:58 PM Turkey B wrote: Reason for CRM: pt requests routine colonoscopy  Has patient seen PCP for this complaint? No , since routine *If NO, is insurance requiring patient see PCP for this issue before PCP can refer them? Referral for which specialty: colonoscopy Preferred provider/office: ? Reason for referral: routine colonscopy

## 2022-11-09 ENCOUNTER — Other Ambulatory Visit: Payer: Self-pay | Admitting: Family Medicine

## 2022-11-09 NOTE — Telephone Encounter (Signed)
Requested Prescriptions  Refused Prescriptions Disp Refills   metoprolol succinate (TOPROL-XL) 25 MG 24 hr tablet [Pharmacy Med Name: METOPROLOL SUCC ER 25 MG TAB] 90 tablet 3    Sig: TAKE 1 TABLET (25 MG TOTAL) BY MOUTH DAILY.     Cardiovascular:  Beta Blockers Passed - 11/09/2022 11:36 AM      Passed - Last BP in normal range    BP Readings from Last 1 Encounters:  10/06/22 106/69         Passed - Last Heart Rate in normal range    Pulse Readings from Last 1 Encounters:  10/06/22 (!) 55         Passed - Valid encounter within last 6 months    Recent Outpatient Visits           1 month ago Vaginal prolapse   [facility] Towner, [place], MD   2 months ago Rash   [facility] [place], [place], PA-C   4 months ago Lipoma of abdominal wall   [facility] Merita Norton T, FNP   8 months ago Type 2 diabetes mellitus with hyperglycemia, without long-term current use of insulin (HCC)   [facility] Simmons-Robinson, [place], MD   9 months ago Type 2 diabetes mellitus with hyperglycemia, without long-term current use of insulin (HCC)   [facility] Simmons-Robinson, Tawanna Cooler, MD       Future Appointments             In 4 months Simmons-Robinson, Tawanna Cooler, MD [facility], [facility]

## 2022-11-10 ENCOUNTER — Telehealth: Payer: Self-pay | Admitting: Family Medicine

## 2022-11-10 ENCOUNTER — Ambulatory Visit (INDEPENDENT_AMBULATORY_CARE_PROVIDER_SITE_OTHER): Payer: Medicare HMO

## 2022-11-10 DIAGNOSIS — E538 Deficiency of other specified B group vitamins: Secondary | ICD-10-CM | POA: Diagnosis not present

## 2022-11-10 MED ORDER — CYANOCOBALAMIN 1000 MCG/ML IJ SOLN
1000.0000 ug | Freq: Once | INTRAMUSCULAR | Status: AC
Start: 2022-11-10 — End: 2022-11-10
  Administered 2022-11-10: 1000 ug via INTRAMUSCULAR

## 2022-11-10 NOTE — Telephone Encounter (Signed)
Opened in Error.

## 2022-12-08 ENCOUNTER — Ambulatory Visit (INDEPENDENT_AMBULATORY_CARE_PROVIDER_SITE_OTHER): Payer: Medicare HMO

## 2022-12-08 DIAGNOSIS — E538 Deficiency of other specified B group vitamins: Secondary | ICD-10-CM

## 2022-12-08 MED ORDER — CYANOCOBALAMIN 1000 MCG/ML IJ SOLN
1000.0000 ug | Freq: Once | INTRAMUSCULAR | Status: AC
Start: 2022-12-08 — End: 2022-12-08
  Administered 2022-12-08: 1000 ug via INTRAMUSCULAR

## 2022-12-08 NOTE — Progress Notes (Signed)
Patient tolerated B-12 injection well.

## 2022-12-13 ENCOUNTER — Ambulatory Visit
Admission: RE | Admit: 2022-12-13 | Discharge: 2022-12-13 | Disposition: A | Discharge status: Home / Self Care | Payer: Medicare HMO | Source: Ambulatory Visit | Attending: Family Medicine | Admitting: Family Medicine

## 2022-12-13 DIAGNOSIS — Z780 Asymptomatic menopausal state: Secondary | ICD-10-CM | POA: Insufficient documentation

## 2022-12-13 DIAGNOSIS — Z1231 Encounter for screening mammogram for malignant neoplasm of breast: Secondary | ICD-10-CM | POA: Insufficient documentation

## 2022-12-15 ENCOUNTER — Encounter: Payer: Self-pay | Admitting: Family Medicine

## 2022-12-15 DIAGNOSIS — M8580 Other specified disorders of bone density and structure, unspecified site: Secondary | ICD-10-CM | POA: Insufficient documentation

## 2022-12-16 ENCOUNTER — Other Ambulatory Visit: Payer: Self-pay | Admitting: Family Medicine

## 2022-12-16 DIAGNOSIS — R928 Other abnormal and inconclusive findings on diagnostic imaging of breast: Secondary | ICD-10-CM

## 2022-12-19 ENCOUNTER — Other Ambulatory Visit: Payer: Self-pay | Admitting: Family Medicine

## 2022-12-20 ENCOUNTER — Ambulatory Visit: Admit: 2022-12-20 | Discharge: 2022-12-21

## 2022-12-20 DIAGNOSIS — I2510 Atherosclerotic heart disease of native coronary artery without angina pectoris: Principal | ICD-10-CM

## 2022-12-20 DIAGNOSIS — I214 Non-ST elevation (NSTEMI) myocardial infarction: Principal | ICD-10-CM

## 2022-12-20 DIAGNOSIS — I259 Chronic ischemic heart disease, unspecified: Principal | ICD-10-CM

## 2022-12-20 DIAGNOSIS — I10 Essential (primary) hypertension: Principal | ICD-10-CM

## 2022-12-20 DIAGNOSIS — Z955 Presence of coronary angioplasty implant and graft: Secondary | ICD-10-CM | POA: Diagnosis not present

## 2022-12-20 DIAGNOSIS — E119 Type 2 diabetes mellitus without complications: Secondary | ICD-10-CM | POA: Diagnosis not present

## 2022-12-20 DIAGNOSIS — Z79899 Other long term (current) drug therapy: Secondary | ICD-10-CM | POA: Diagnosis not present

## 2022-12-20 DIAGNOSIS — Z79890 Hormone replacement therapy (postmenopausal): Secondary | ICD-10-CM | POA: Diagnosis not present

## 2022-12-20 DIAGNOSIS — Z7984 Long term (current) use of oral hypoglycemic drugs: Secondary | ICD-10-CM | POA: Diagnosis not present

## 2022-12-20 DIAGNOSIS — R079 Chest pain, unspecified: Secondary | ICD-10-CM | POA: Diagnosis not present

## 2022-12-20 DIAGNOSIS — Z7982 Long term (current) use of aspirin: Secondary | ICD-10-CM | POA: Diagnosis not present

## 2022-12-20 NOTE — Telephone Encounter (Signed)
Requested medications are due for refill today.  yes  Requested medications are on the active medications list.  yes  Last refill. 09/24/2022 #90 0 rf  Future visit scheduled.   yes  Notes to clinic.  Labs are abnormal and expired.    Requested Prescriptions  Pending Prescriptions Disp Refills   levothyroxine (SYNTHROID) 75 MCG tablet [Pharmacy Med Name: LEVOTHYROXINE 75 MCG TABLET] 90 tablet 0    Sig: TAKE 1 TABLET BY MOUTH EVERY DAY BEFORE BREAKFAST     Endocrinology:  Hypothyroid Agents Failed - 12/19/2022  8:45 AM      Failed - TSH in normal range and within 360 days    TSH  Date Value Ref Range Status  08/04/2021 8.650 (H) 0.450 - 4.500 uIU/mL Final         Passed - Valid encounter within last 12 months    Recent Outpatient Visits           3 months ago Vaginal prolapse   [facility] Simmons-Robinson, [place], MD   4 months ago Rash   [facility] [place], [place], PA-C   5 months ago Lipoma of abdominal wall   [facility] Merita Norton T, FNP   9 months ago Type 2 diabetes mellitus with hyperglycemia, without long-term current use of insulin (HCC)   [place] [facility] Simmons-Robinson, [place], MD   10 months ago Type 2 diabetes mellitus with hyperglycemia, without long-term current use of insulin (HCC)   [facility] Simmons-Robinson, Tawanna Cooler, MD       Future Appointments             In 2 months Simmons-Robinson, Tawanna Cooler, MD [facility], [facility]

## 2022-12-22 ENCOUNTER — Ambulatory Visit
Admission: RE | Admit: 2022-12-22 | Discharge: 2022-12-22 | Disposition: A | Discharge status: Home / Self Care | Payer: Medicare HMO | Source: Ambulatory Visit | Attending: Family Medicine | Admitting: Family Medicine

## 2022-12-22 DIAGNOSIS — R928 Other abnormal and inconclusive findings on diagnostic imaging of breast: Secondary | ICD-10-CM | POA: Insufficient documentation

## 2022-12-22 DIAGNOSIS — R92322 Mammographic fibroglandular density, left breast: Secondary | ICD-10-CM | POA: Diagnosis not present

## 2022-12-23 DIAGNOSIS — I259 Chronic ischemic heart disease, unspecified: Secondary | ICD-10-CM | POA: Diagnosis not present

## 2022-12-23 DIAGNOSIS — I2510 Atherosclerotic heart disease of native coronary artery without angina pectoris: Secondary | ICD-10-CM | POA: Diagnosis not present

## 2022-12-23 DIAGNOSIS — I10 Essential (primary) hypertension: Secondary | ICD-10-CM | POA: Diagnosis not present

## 2022-12-23 DIAGNOSIS — I214 Non-ST elevation (NSTEMI) myocardial infarction: Secondary | ICD-10-CM | POA: Diagnosis not present

## 2023-01-03 ENCOUNTER — Telehealth: Payer: Self-pay | Admitting: Family Medicine

## 2023-01-03 ENCOUNTER — Other Ambulatory Visit: Payer: Self-pay | Admitting: Family Medicine

## 2023-01-03 MED ORDER — CYANOCOBALAMIN 1000 MCG/ML IJ SOLN: 1000.0000 ug | INTRAMUSCULAR | 0 refills | Status: DC

## 2023-01-03 NOTE — Telephone Encounter (Signed)
Pt is calling to follow up on script for B-12. Pt was advised that nurse is no longer giving B-12 script. Pt would like the script to be sent to the below pharmacy. Last B-12 shot was 12/08/22   Please advise  [id number] - [place], [place] - [address] Phone: [phone]  Fax: [phone]

## 2023-01-03 NOTE — Telephone Encounter (Signed)
Rx sent to pharmacy for B12 injections

## 2023-01-04 ENCOUNTER — Telehealth: Payer: Self-pay | Admitting: Family Medicine

## 2023-01-04 NOTE — Telephone Encounter (Signed)
Pt stated her insurance isn't covering Rx for B12 shot. Pt is asking if she can just take the vitamin B12 orally instead; she stated her cardiologist told her to ask.  Please advise.

## 2023-01-05 NOTE — Telephone Encounter (Signed)
Advised 

## 2023-01-05 NOTE — Telephone Encounter (Signed)
Reviewed insurance denial for b12 injections   She can take the oral OTC b12 daily and we can plan to check at her upcoming appt with me in Feb 2025.

## 2023-02-06 ENCOUNTER — Other Ambulatory Visit: Payer: Self-pay | Admitting: Family Medicine

## 2023-02-08 ENCOUNTER — Other Ambulatory Visit: Payer: Self-pay | Admitting: Family Medicine

## 2023-02-08 ENCOUNTER — Ambulatory Visit: Payer: Self-pay

## 2023-02-08 NOTE — Telephone Encounter (Signed)
 Medication Refill -  Most Recent Primary Care Visit:  Provider: ROSAS, JOSELINE E  Department: [facility]  Visit Type: NURSE VISIT  Date: 12/08/2022  Medication: metoprolol succinate (TOPROL-XL) 25 MG 24 hr tablet   Has the patient contacted their pharmacy? Yes Contact PCP office   Is this the correct pharmacy for this prescription? Yes If no, delete pharmacy and type the correct one.  This is the patient's preferred pharmacy:  [id number] - [place], [place] - [address] [place] [address] Phone: [phone] Fax: [phone]   Patient is completely out and would like request expedited, informed patient please allow 48 to 72 hour turn around. Caller would like a follow up call when completed.

## 2023-02-08 NOTE — Telephone Encounter (Addendum)
  Chief Complaint: Needed refill for metoprolol 25 mg Symptoms:  Frequency:  Pertinent Negatives: Patient denies  Disposition: [] ED /[] Urgent Care (no appt availability in office) / [] Appointment(In office/virtual)/ []  [facility]/ [] Home Care/ [] Refused Recommended Disposition /[] [place] [facility]/ [x]  Follow-up with PCP Additional Notes: Pt called regarding refill. NT was able to refill per protocol. Called pt back and let her know that rx was sent over to pharmacy.    Summary: Rx concerns   Pt called to check on refill request for metoprolol succinate (TOPROL-XL) 25 MG 24 hr tablet, pt stated she is completely out and wanted to know if it was okay to be without it.  Pt seeking clinical advice.     Answer Assessment - Initial Assessment Questions 1. DRUG NAME: What medicine do you need to have refilled?     Metoprolol 25 mg 2. REFILLS REMAINING: How many refills are remaining? (Note: The label on the medicine or pill bottle will show how many refills are remaining. If there are no refills remaining, then a renewal may be needed.)     0  Protocols used: Medication Refill and Renewal Call-A-AH

## 2023-02-08 NOTE — Telephone Encounter (Signed)
 Requested Prescriptions  Pending Prescriptions Disp Refills   metoprolol succinate (TOPROL-XL) 25 MG 24 hr tablet [Pharmacy Med Name: METOPROLOL SUCC ER 25 MG TAB] 90 tablet 0    Sig: TAKE 1 TABLET (25 MG TOTAL) BY MOUTH DAILY.     Cardiovascular:  Beta Blockers Passed - 02/08/2023  5:09 PM      Passed - Last BP in normal range    BP Readings from Last 1 Encounters:  10/06/22 106/69         Passed - Last Heart Rate in normal range    Pulse Readings from Last 1 Encounters:  10/06/22 (!) 55         Passed - Valid encounter within last 6 months    Recent Outpatient Visits           4 months ago Vaginal prolapse   [facility] Harrison, [place], MD   5 months ago Rash   [facility] [place], [place], PA-C   7 months ago Lipoma of abdominal wall   [facility] Emilio Marseille T, FNP   11 months ago Type 2 diabetes mellitus with hyperglycemia, without long-term current use of insulin (HCC)   [facility] Simmons-Robinson, [place], MD   12 months ago Type 2 diabetes mellitus with hyperglycemia, without long-term current use of insulin (HCC)    [facility] Simmons-Robinson, [place], MD       Future Appointments             In 1 month Simmons-Robinson, Rockie, MD [facility], [facility]

## 2023-02-12 NOTE — Telephone Encounter (Signed)
 Duplicate request, Rx reordered 02/08/23. Requested Prescriptions  Pending Prescriptions Disp Refills   metoprolol succinate (TOPROL-XL) 25 MG 24 hr tablet 90 tablet 3    Sig: Take 1 tablet (25 mg total) by mouth daily.     Cardiovascular:  Beta Blockers Passed - 02/12/2023 10:07 AM      Passed - Last BP in normal range    BP Readings from Last 1 Encounters:  10/06/22 106/69         Passed - Last Heart Rate in normal range    Pulse Readings from Last 1 Encounters:  10/06/22 (!) 55         Passed - Valid encounter within last 6 months    Recent Outpatient Visits           5 months ago Vaginal prolapse   [facility] [place], [place], MD   5 months ago Rash   [facility] [place], [place], PA-C   7 months ago Lipoma of abdominal wall   [facility] Emilio Marseille T, FNP   11 months ago Type 2 diabetes mellitus with hyperglycemia, without long-term current use of insulin (HCC)   [facility] Simmons-Robinson, [place], MD   1 year ago Type 2 diabetes mellitus with hyperglycemia, without long-term current use of insulin (HCC)   [facility] Simmons-Robinson, [place], MD       Future Appointments             In 1 month Simmons-Robinson, Rockie, MD [facility], [facility]

## 2023-02-13 DIAGNOSIS — I240 Acute coronary thrombosis not resulting in myocardial infarction: Principal | ICD-10-CM

## 2023-02-13 DIAGNOSIS — I259 Chronic ischemic heart disease, unspecified: Principal | ICD-10-CM

## 2023-02-13 MED ORDER — REPATHA PUSHTRONEX 420 MG/3.5 ML SUBCUTANEOUS WEARABLE INJECTOR
0 refills | 0.00 days
Start: 2023-02-13 — End: ?

## 2023-02-14 DIAGNOSIS — I259 Chronic ischemic heart disease, unspecified: Principal | ICD-10-CM

## 2023-02-14 DIAGNOSIS — I240 Acute coronary thrombosis not resulting in myocardial infarction: Principal | ICD-10-CM

## 2023-02-14 MED ORDER — REPATHA PUSHTRONEX 420 MG/3.5 ML SUBCUTANEOUS WEARABLE INJECTOR
0 refills | 0.00 days
Start: 2023-02-14 — End: ?

## 2023-02-15 DIAGNOSIS — I240 Acute coronary thrombosis not resulting in myocardial infarction: Principal | ICD-10-CM

## 2023-02-15 DIAGNOSIS — I259 Chronic ischemic heart disease, unspecified: Principal | ICD-10-CM

## 2023-02-15 MED ORDER — REPATHA PUSHTRONEX 420 MG/3.5 ML SUBCUTANEOUS WEARABLE INJECTOR
0 refills | 0.00 days
Start: 2023-02-15 — End: ?

## 2023-02-16 DIAGNOSIS — I259 Chronic ischemic heart disease, unspecified: Principal | ICD-10-CM

## 2023-02-16 DIAGNOSIS — I240 Acute coronary thrombosis not resulting in myocardial infarction: Principal | ICD-10-CM

## 2023-02-16 MED ORDER — REPATHA PUSHTRONEX 420 MG/3.5 ML SUBCUTANEOUS WEARABLE INJECTOR
0 refills | 0.00 days
Start: 2023-02-16 — End: ?

## 2023-03-01 ENCOUNTER — Telehealth: Payer: Self-pay | Admitting: Family Medicine

## 2023-03-01 DIAGNOSIS — R110 Nausea: Secondary | ICD-10-CM

## 2023-03-01 NOTE — Telephone Encounter (Signed)
PT is calling in because she says her stomach is upset and she wants to know if it's okay for her to skip her meds for the day because she doesn't want to throw them up. Please follow up with pt.

## 2023-03-02 MED ORDER — ONDANSETRON 4 MG PO TBDP
4.0000 mg | ORAL_TABLET | Freq: Three times a day (TID) | ORAL | 0 refills | Status: AC | PRN
Start: 2023-03-02 — End: ?

## 2023-03-02 NOTE — Telephone Encounter (Signed)
Will send anti nausea medication to help symptoms and is able to take blood pressure medications and thyroid medications   I would recommend holding the metformin if she is not eating like normal until she feels better

## 2023-03-03 NOTE — Telephone Encounter (Signed)
Notified pt w/ message below and voiced understanding.

## 2023-03-05 ENCOUNTER — Other Ambulatory Visit: Payer: Self-pay | Admitting: Family Medicine

## 2023-03-16 ENCOUNTER — Encounter: Payer: Self-pay | Admitting: Family Medicine

## 2023-03-16 ENCOUNTER — Ambulatory Visit (INDEPENDENT_AMBULATORY_CARE_PROVIDER_SITE_OTHER): Payer: Medicare HMO | Admitting: Family Medicine

## 2023-03-16 VITALS — BP 124/73 | HR 65 | Resp 20 | Ht 64.0 in | Wt 129.7 lb

## 2023-03-16 DIAGNOSIS — E1169 Type 2 diabetes mellitus with other specified complication: Secondary | ICD-10-CM | POA: Diagnosis not present

## 2023-03-16 DIAGNOSIS — I2510 Atherosclerotic heart disease of native coronary artery without angina pectoris: Secondary | ICD-10-CM

## 2023-03-16 DIAGNOSIS — I10 Essential (primary) hypertension: Secondary | ICD-10-CM | POA: Diagnosis not present

## 2023-03-16 DIAGNOSIS — E538 Deficiency of other specified B group vitamins: Secondary | ICD-10-CM

## 2023-03-16 DIAGNOSIS — E038 Other specified hypothyroidism: Secondary | ICD-10-CM | POA: Diagnosis not present

## 2023-03-16 DIAGNOSIS — Z7984 Long term (current) use of oral hypoglycemic drugs: Secondary | ICD-10-CM | POA: Diagnosis not present

## 2023-03-16 DIAGNOSIS — E1165 Type 2 diabetes mellitus with hyperglycemia: Secondary | ICD-10-CM | POA: Diagnosis not present

## 2023-03-16 DIAGNOSIS — E119 Type 2 diabetes mellitus without complications: Secondary | ICD-10-CM

## 2023-03-16 LAB — POCT GLYCOSYLATED HEMOGLOBIN (HGB A1C): Hemoglobin A1C: 5.8 % — AB (ref 4.0–5.6)

## 2023-03-16 MED ORDER — LEVOTHYROXINE SODIUM 75 MCG PO TABS: 75.0000 ug | ORAL_TABLET | Freq: Every day | ORAL | 3 refills | Status: DC

## 2023-03-16 NOTE — Assessment & Plan Note (Signed)
 Hypertension well controlled with current medications. Blood pressure today was 124/73 mmHg. Currently on amlodipine 5 mg and hydrochlorothiazide 25 mg daily. Chronic - Continue amlodipine 5 mg daily - Continue hydrochlorothiazide 25 mg daily

## 2023-03-16 NOTE — Assessment & Plan Note (Signed)
 Chronic  Vitamin B12 deficiency managed with 1000 mcg daily. Will check levels to ensure adequacy of supplementation. - Order vitamin B12 level

## 2023-03-16 NOTE — Assessment & Plan Note (Signed)
 Hypothyroidism managed with levothyroxine . Concern about recall on current medication. Plan to switch to Synthroid  75 mcg. Will monitor thyroid  function tests. Chronic  - Discontinue current levothyroxine  - Prescribe Synthroid  75 mcg - Order TSH, T4, and T3 levels

## 2023-03-16 NOTE — Progress Notes (Signed)
 Established patient visit   Patient: Sheila Herrera   DOB: 06-29-1950   73 y.o. Female  MRN: [id number] Visit Date: 03/16/2023  Today's healthcare provider: Rockie Agent, MD   Chief Complaint  Patient presents with   Diabetes   Subjective       Discussed the use of AI scribe software for clinical note transcription with the patient, who gave verbal consent to proceed.  History of Present Illness   Sheila Herrera is a 73 year old female who presents for follow-up of her well-controlled type 2 diabetes.  Type 2 diabetes is well-controlled with an A1c of 5.8. She is not taking metformin  daily, instead using it every two days, and attributes her diabetes control largely to diet. She is concerned about her diabetes potentially worsening if she stops metformin  completely.  She is currently taking levothyroxine  for her thyroid  condition but is concerned about a recall on her medication. She is almost finished with her current bottle and is unsure if her batch is affected by the recall.  Hypertension is well-controlled with amlodipine 5 mg and hydrochlorothiazide 25 mg, with a blood pressure reading of 124/73 mmHg.  She has a history of vitamin B12 deficiency and takes 1000 mcg daily. She wants to have her cholesterol checked as per her cardiologist's request.  She experienced a recent episode of norovirus, which resulted in four days of vomiting and diarrhea, leading to a temporary inability to eat. She feels much better now and has resumed eating.  She reports a weight of 129 pounds, down from her usual 134 pounds, which she attributes to her recent illness. She is actively trying to maintain her weight and is engaging in exercise, including Pilates, which she enjoys and finds beneficial for her posture and overall well-being.  She mentions a prolapse condition that she is managing without surgical intervention, opting to live with it rather than undergo a  hysterectomy.         Past Medical History:  Diagnosis Date   Hypertension     Medications: Outpatient Medications Prior to Visit  Medication Sig   Alirocumab (PRALUENT) 150 MG/ML SOAJ Inject into the skin.   amLODipine (NORVASC) 5 MG tablet TAKE 1 TABLET (5 MG TOTAL) BY MOUTH DAILY.   aspirin 81 MG chewable tablet Chew 81 mg by mouth.   cyanocobalamin  (VITAMIN B12) 1000 MCG tablet Take 1,000 mcg by mouth daily.   ergocalciferol  (VITAMIN D2) 1.25 MG (50000 UT) capsule Take 1 capsule once a week for 8 weeks.   hydrochlorothiazide (HYDRODIURIL) 25 MG tablet hydrochlorothiazide 25 mg tablet  TAKE 1 TABLET BY MOUTH EVERY DAY   metFORMIN  (GLUCOPHAGE ) 500 MG tablet Take 1 tablet (500 mg total) by mouth daily with breakfast.   metoprolol succinate (TOPROL-XL) 25 MG 24 hr tablet TAKE 1 TABLET (25 MG TOTAL) BY MOUTH DAILY.   nitroGLYCERIN (NITROSTAT) 0.4 MG SL tablet    nystatin  (MYCOSTATIN /NYSTOP ) powder Apply 1 Application topically 3 (three) times daily.   ondansetron  (ZOFRAN -ODT) 4 MG disintegrating tablet Take 1 tablet (4 mg total) by mouth every 8 (eight) hours as needed for nausea or vomiting.   telmisartan (MICARDIS) 20 MG tablet Take 20 mg by mouth every other day.   [DISCONTINUED] cyanocobalamin  (VITAMIN B12) 1000 MCG/ML injection Inject 1 mL (1,000 mcg total) into the muscle every 30 (thirty) days.   [DISCONTINUED] levothyroxine  (SYNTHROID ) 75 MCG tablet TAKE 1 TABLET BY MOUTH EVERY DAY BEFORE BREAKFAST   estradiol  (ESTRACE ) 0.1 MG/GM  vaginal cream Estrogen Cream Instruction Discard applicator Apply pea sized amount to tip of finger to urethra before bed. Wash hands well after application. Use Monday, Wednesday and Friday (Patient not taking: Reported on 03/16/2023)   meloxicam  (MOBIC ) 7.5 MG tablet TAKE 1 TABLET BY MOUTH EVERY DAY (Patient not taking: Reported on 03/16/2023)   [DISCONTINUED] fluconazole  (DIFLUCAN ) 100 MG tablet Take 1 tablet (100 mg total) by mouth daily.   No  facility-administered medications prior to visit.    Review of Systems  Last metabolic panel Lab Results  Component Value Date   GLUCOSE 105 (H) 08/21/2022   NA 137 08/21/2022   K 3.9 08/21/2022   CL 100 08/21/2022   CO2 27 08/21/2022   BUN 23 08/21/2022   CREATININE 0.93 08/21/2022   GFRNONAA >60 08/21/2022   CALCIUM 9.7 08/21/2022   PHOS 3.4 11/17/2021   PROT 7.7 08/21/2022   ALBUMIN 4.3 08/21/2022   LABGLOB 2.7 02/05/2016   AGRATIO 1.5 02/05/2016   BILITOT 1.0 08/21/2022   ALKPHOS 45 08/21/2022   AST 17 08/21/2022   ALT 14 08/21/2022   ANIONGAP 10 08/21/2022   Last lipids No results found for: CHOL, HDL, LDLCALC, LDLDIRECT, TRIG, CHOLHDL Last hemoglobin A1c Lab Results  Component Value Date   HGBA1C 5.8 (A) 03/16/2023   Last thyroid  functions Lab Results  Component Value Date   TSH 8.650 (H) 08/04/2021   T4TOTAL 7.4 02/05/2016   Last vitamin B12 and Folate No results found for: VITAMINB12, FOLATE      Objective    BP 124/73   Pulse 65   Resp 20   Ht 5' 4 (1.626 m)   Wt 129 lb 11.2 oz (58.8 kg)   SpO2 100%   BMI 22.26 kg/m  BP Readings from Last 3 Encounters:  03/16/23 124/73  10/06/22 106/69  09/15/22 125/79   Wt Readings from Last 3 Encounters:  03/16/23 129 lb 11.2 oz (58.8 kg)  10/06/22 132 lb 14.4 oz (60.3 kg)  09/15/22 133 lb 9.6 oz (60.6 kg)        Physical Exam Vitals reviewed.  Constitutional:      General: She is not in acute distress.    Appearance: Normal appearance. She is not ill-appearing, toxic-appearing or diaphoretic.  Eyes:     Conjunctiva/sclera: Conjunctivae normal.  Cardiovascular:     Rate and Rhythm: Normal rate and regular rhythm.     Pulses: Normal pulses.     Heart sounds: Normal heart sounds. No murmur heard.    No friction rub. No gallop.  Pulmonary:     Effort: Pulmonary effort is normal. No respiratory distress.     Breath sounds: Normal breath sounds. No stridor. No wheezing, rhonchi  or rales.  Abdominal:     General: Bowel sounds are normal. There is no distension.     Palpations: Abdomen is soft.     Tenderness: There is no abdominal tenderness.  Musculoskeletal:     Right lower leg: No edema.     Left lower leg: No edema.  Skin:    Findings: No erythema or rash.  Neurological:     Mental Status: She is alert and oriented to person, place, and time.  Psychiatric:        Mood and Affect: Mood and affect normal.        Speech: Speech normal.        Behavior: Behavior normal. Behavior is cooperative.       Results for orders placed or performed  in visit on 03/16/23  POCT glycosylated hemoglobin (Hb A1C)  Result Value Ref Range   Hemoglobin A1C 5.8 (A) 4.0 - 5.6 %    Assessment & Plan     Problem List Items Addressed This Visit       Cardiovascular and Mediastinum   HTN (hypertension)   Hypertension well controlled with current medications. Blood pressure today was 124/73 mmHg. Currently on amlodipine  5 mg and hydrochlorothiazide 25 mg daily. Chronic - Continue amlodipine  5 mg daily - Continue hydrochlorothiazide 25 mg daily      Coronary artery disease involving native coronary artery   Relevant Orders   Lipid Profile     Endocrine   RESOLVED: Type 2 diabetes mellitus with hyperglycemia, without long-term current use of insulin (HCC) - Primary   Hypothyroidism   Hypothyroidism managed with levothyroxine . Concern about recall on current medication. Plan to switch to Synthroid  75 mcg. Will monitor thyroid  function tests. Chronic  - Discontinue current levothyroxine  - Prescribe Synthroid  75 mcg - Order TSH, T4, and T3 levels      Relevant Medications   levothyroxine  (SYNTHROID ) 75 MCG tablet   Other Relevant Orders   TSH+T4F+T3Free     Other   B12 deficiency   Chronic  Vitamin B12 deficiency managed with 1000 mcg daily. Will check levels to ensure adequacy of supplementation. - Order vitamin B12 level      Relevant Orders   Vitamin  B12        Type 2 Diabetes Mellitus, in remission Type 2 diabetes mellitus, well controlled with an A1c of 5.8% now considered in remission, will update diagnosis on problem list. Managing primarily through diet control, with concerns about discontinuing metformin . Discussed benefits of diet control and using metformin  as a backup. Emphasized regular monitoring. - Continue current diet control regimen - continue 500mg  daily of metformin , pt preference - Monitor A1c levels regularly - Reassess in 6 months    General Health Maintenance Requested cholesterol check as per cardiologist's recommendation. - Order lipid panel - Continue regular exercise - Monitor weight    Return in about 6 months (around 09/13/2023) for DM, HTN, Cholesterol.         Rockie Agent, MD  [facility] [phone] (phone) [phone] (fax)  [facility]

## 2023-03-17 ENCOUNTER — Encounter: Payer: Self-pay | Admitting: Family Medicine

## 2023-03-17 LAB — TSH+T4F+T3FREE
Free T4: 2.02 ng/dL — ABNORMAL HIGH (ref 0.82–1.77)
T3, Free: 2.3 pg/mL (ref 2.0–4.4)
TSH: 1.6 u[IU]/mL (ref 0.450–4.500)

## 2023-03-17 LAB — LIPID PANEL
Chol/HDL Ratio: 2.5 {ratio} (ref 0.0–4.4)
Cholesterol, Total: 152 mg/dL (ref 100–199)
HDL: 61 mg/dL (ref >39)
LDL Chol Calc (NIH): 68 mg/dL (ref 0–99)
Triglycerides: 131 mg/dL (ref 0–149)
VLDL Cholesterol Cal: 23 mg/dL (ref 5–40)

## 2023-03-17 LAB — VITAMIN B12: Vitamin B-12: >2000 pg/mL — ABNORMAL HIGH (ref 232–1245)

## 2023-03-21 MED ORDER — HYDROCHLOROTHIAZIDE 25 MG TABLET
ORAL_TABLET | Freq: Every day | ORAL | 1 refills | 90.00 days | Status: CP
Start: 2023-03-21 — End: ?

## 2023-05-01 ENCOUNTER — Other Ambulatory Visit: Payer: Self-pay

## 2023-05-01 ENCOUNTER — Emergency Department

## 2023-05-01 ENCOUNTER — Emergency Department
Admission: EM | Admit: 2023-05-01 | Discharge: 2023-05-01 | Disposition: A | Discharge status: Home / Self Care | Attending: Emergency Medicine | Admitting: Emergency Medicine

## 2023-05-01 DIAGNOSIS — I517 Cardiomegaly: Secondary | ICD-10-CM | POA: Diagnosis not present

## 2023-05-01 DIAGNOSIS — Z955 Presence of coronary angioplasty implant and graft: Secondary | ICD-10-CM | POA: Diagnosis not present

## 2023-05-01 DIAGNOSIS — R55 Syncope and collapse: Secondary | ICD-10-CM | POA: Diagnosis not present

## 2023-05-01 DIAGNOSIS — E039 Hypothyroidism, unspecified: Secondary | ICD-10-CM | POA: Diagnosis not present

## 2023-05-01 DIAGNOSIS — E119 Type 2 diabetes mellitus without complications: Secondary | ICD-10-CM | POA: Insufficient documentation

## 2023-05-01 DIAGNOSIS — R6889 Other general symptoms and signs: Secondary | ICD-10-CM | POA: Diagnosis not present

## 2023-05-01 DIAGNOSIS — R404 Transient alteration of awareness: Secondary | ICD-10-CM | POA: Diagnosis not present

## 2023-05-01 DIAGNOSIS — R42 Dizziness and giddiness: Secondary | ICD-10-CM | POA: Diagnosis not present

## 2023-05-01 DIAGNOSIS — Z743 Need for continuous supervision: Secondary | ICD-10-CM | POA: Diagnosis not present

## 2023-05-01 DIAGNOSIS — I10 Essential (primary) hypertension: Secondary | ICD-10-CM | POA: Diagnosis not present

## 2023-05-01 LAB — BASIC METABOLIC PANEL
Anion gap: 7 (ref 5–15)
BUN: 27 mg/dL — ABNORMAL HIGH (ref 8–23)
CO2: 27 mmol/L (ref 22–32)
Calcium: 9.8 mg/dL (ref 8.9–10.3)
Chloride: 104 mmol/L (ref 98–111)
Creatinine, Ser: 0.9 mg/dL (ref 0.44–1.00)
GFR, Estimated: >60 mL/min (ref >60)
Glucose, Bld: 113 mg/dL — ABNORMAL HIGH (ref 70–99)
Potassium: 3.4 mmol/L — ABNORMAL LOW (ref 3.5–5.1)
Sodium: 138 mmol/L (ref 135–145)

## 2023-05-01 LAB — CBC
HCT: 40.4 % (ref 36.0–46.0)
Hemoglobin: 14.2 g/dL (ref 12.0–15.0)
MCH: 33.6 pg (ref 26.0–34.0)
MCHC: 35.1 g/dL (ref 30.0–36.0)
MCV: 95.7 fL (ref 80.0–100.0)
Platelets: 152 10*3/uL (ref 150–400)
RBC: 4.22 MIL/uL (ref 3.87–5.11)
RDW: 13.1 % (ref 11.5–15.5)
WBC: 4.5 10*3/uL (ref 4.0–10.5)
nRBC: 0 % (ref 0.0–0.2)

## 2023-05-01 LAB — TROPONIN I (HIGH SENSITIVITY): Troponin I (High Sensitivity): 13 ng/L (ref <18)

## 2023-05-01 LAB — CBG MONITORING, ED: Glucose-Capillary: 89 mg/dL (ref 70–99)

## 2023-05-01 MED ORDER — SODIUM CHLORIDE 0.9 % IV BOLUS
1000.0000 mL | Freq: Once | INTRAVENOUS | Status: AC
  Administered 2023-05-01: 1000 mL via INTRAVENOUS

## 2023-05-01 NOTE — ED Notes (Addendum)
 Pt walked 52 yrds no issues Pts husband pointed out PTs "red faced"

## 2023-05-01 NOTE — ED Provider Notes (Signed)
 [facility] Provider Note    Event Date/Time   First MD Initiated Contact with Patient 05/01/23 1629     (approximate)   History   Near Syncope   HPI Sheila Herrera is a 73 y.o. female with history of HTN, DM 2, hypothyroidism presenting today for syncope.  Patient was outside working today for approximately 3 hours when she had an episode of getting lightheaded and sweaty followed by syncope.  Witnessed by the husband.  No other acute events around it.  Patient states she now feels well with no ongoing symptoms.  They also note this area was recently painted and they could smell fumes in the area during the day today.  She notes she did not eat or drink a significant amount early today and might be dehydrated.  Otherwise denies chest pain, shortness of breath, vomiting, nausea, abdominal pain.  No prior episodes similar.     Physical Exam   Triage Vital Signs: ED Triage Vitals  Encounter Vitals Group     BP 05/01/23 1627 (!) 143/117     Systolic BP Percentile --      Diastolic BP Percentile --      Pulse Rate 05/01/23 1627 71     Resp 05/01/23 1627 17     Temp 05/01/23 1627 98.3 F (36.8 C)     Temp Source 05/01/23 1627 Oral     SpO2 05/01/23 1627 100 %     Weight 05/01/23 1628 126 lb (57.2 kg)     Height 05/01/23 1628 5\' 4"  (1.626 m)     Head Circumference --      Peak Flow --      Pain Score 05/01/23 1628 0     Pain Loc --      Pain Education --      Exclude from Growth Chart --     Most recent vital signs: Vitals:   05/01/23 1627  BP: (!) 143/117  Pulse: 71  Resp: 17  Temp: 98.3 F (36.8 C)  SpO2: 100%   Physical Exam: I have reviewed the vital signs and nursing notes. General: Awake, alert, no acute distress.  Nontoxic appearing. Head:  Atraumatic, normocephalic.   ENT:  EOM intact, PERRL. Oral mucosa is pink and moist with no lesions. Neck: Neck is supple with full range of motion, No meningeal signs. Cardiovascular:  RRR,  No murmurs. Peripheral pulses palpable and equal bilaterally. Respiratory:  Symmetrical chest wall expansion.  No rhonchi, rales, or wheezes.  Good air movement throughout.  No use of accessory muscles.   Musculoskeletal:  No cyanosis or edema. Moving extremities with full ROM Abdomen:  Soft, nontender, nondistended. Neuro:  GCS 15, moving all four extremities, interacting appropriately. Speech clear. Psych:  Calm, appropriate.   Skin:  Warm, dry, no rash.    ED Results / Procedures / Treatments   Labs (all labs ordered are listed, but only abnormal results are displayed) Labs Reviewed  BASIC METABOLIC PANEL - Abnormal; Notable for the following components:      Result Value   Potassium 3.4 (*)    Glucose, Bld 113 (*)    BUN 27 (*)    All other components within normal limits  CBC  CBG MONITORING, ED  TROPONIN I (HIGH SENSITIVITY)     EKG My EKG interpretation: Rate of 69, normal sinus rhythm, incomplete right bundle branch block.  Normal axis.  No acute ST elevations or depressions.  EKG comparable to most recent  1 on 06/03/2021   RADIOLOGY Independently interpreted chest x-ray with no acute abnormalities   PROCEDURES:  Critical Care performed: No  Procedures   MEDICATIONS ORDERED IN ED: Medications  sodium chloride 0.9 % bolus 1,000 mL (0 mLs Intravenous Stopped 05/01/23 1822)     IMPRESSION / MDM / ASSESSMENT AND PLAN / ED COURSE  I reviewed the triage vital signs and the nursing notes.                              Differential diagnosis includes, but is not limited to, vasovagal syncope, orthostatic hypotension, dehydration, cardiac arrhythmia, electrolyte abnormality  Patient's presentation is most consistent with acute complicated illness / injury requiring diagnostic workup.  Patient is a 73 year old female presenting today for syncopal episode with prodromal symptoms including lightheadedness and diaphoresis.  This also included prolonged period outside  today with warmer than normal temperatures, decreased p.o. intake earlier in the day, as well as paint fumes in the area.  Story appears consistent with likely multifactorial explanation including dehydration, exposed fumes, and vasovagal syncope.  Vital signs stable on arrival and exam unremarkable.  EKG consistent with her baseline and unremarkable.  Patient given 1 L fluids.  Troponin consistent with her baseline.  Patient is low risk criteria based on [place] syncope scoring.  Able to ambulate throughout the hall without any concern.  Safe for discharge at this time with low risk and follow-up with PCP.  Given strict return precautions and agreeable with plan.  The patient is on the cardiac monitor to evaluate for evidence of arrhythmia and/or significant heart rate changes. Clinical Course as of 05/01/23 1850  Sun May 01, 2023  1712 Troponin I (High Sensitivity): 13 Consistent with baseline. No need for 2nd troponin with no chest pain today [DW]    Clinical Course User Index [DW] Janith Lima, MD     FINAL CLINICAL IMPRESSION(S) / ED DIAGNOSES   Final diagnoses:  Syncope and collapse     Rx / DC Orders   ED Discharge Orders     None        Note:  This document was prepared using Dragon voice recognition software and may include unintentional dictation errors.   Janith Lima, MD 05/01/23 [phone]

## 2023-05-01 NOTE — ED Triage Notes (Signed)
 Pt was helping family at home outside and became very faint and was feeling like she was going to pass out.

## 2023-05-01 NOTE — Discharge Instructions (Signed)
 Please maintain hydration at home.  Please follow-up with your primary care provider as needed for any recurrent episodes or return to the ED for any severe symptoms.

## 2023-05-07 ENCOUNTER — Other Ambulatory Visit: Payer: Self-pay | Admitting: Family Medicine

## 2023-05-09 ENCOUNTER — Ambulatory Visit: Payer: Self-pay

## 2023-05-09 NOTE — Telephone Encounter (Signed)
   Chief Complaint: dizziness Symptoms: dizziness, congestion Frequency: 1 week + Pertinent Negatives: Patient denies chest pain, sob, vomiting Disposition: [] ED /[] Urgent Care (no appt availability in office) / [x] Appointment(In office/virtual)/ []  [facility]/ [] Home Care/ [] Refused Recommended Disposition /[] [facility]/ []  Follow-up with PCP Additional Notes: Patient reports she was seen in hospital on 2/23 for an episode of dizziness which led to LOC. Patient reports on that day she was on a tractor working outside with her husband, and that on eval ED told her they thought her dizziness was caused by fumes from chemicals they were using and the heat. Patient reports she initially felt better after receiving fluids but states she has just "not felt like herself" since the episode. Patient denies worsening symptoms, states she has had no further episodes of fainting. But states that the dizziness she experienced is still "coming and going when walking". Patient denies any other symptoms. Patient scheduled for a hospital f/u appt 4/2. Advised to call back with worsening symptoms. Patient verbalized understanding.     Copied from CRM [id number]. Topic: Clinical - Red Word Triage >> May 09, 2023  8:32 AM Izetta Dakin wrote: [facility] that prompted transfer to Nurse Triage: Lightheaded x 1 week. Patient states she fainted and went to hospital last week 3/23. Reason for Disposition  [1] MILD dizziness (e.g., walking normally) AND [2] has been evaluated by doctor (or NP/PA) for this  Answer Assessment - Initial Assessment Questions 1. DESCRIPTION: "Describe your dizziness."     lightheaded 2. LIGHTHEADED: "Do you feel lightheaded?" (e.g., somewhat faint, woozy, weak upon standing)     woozy 3. VERTIGO: "Do you feel like either you or the room is spinning or tilting?" (i.e. vertigo)     none 4. SEVERITY: "How bad is it?"  "Do you feel like you are going to faint?" "Can you stand  and walk?"   - MILD: Feels slightly dizzy, but walking normally.   - MODERATE: Feels unsteady when walking, but not falling; interferes with normal activities (e.g., school, work).   - SEVERE: Unable to walk without falling, or requires assistance to walk without falling; feels like passing out now.      mild 5. ONSET:  "When did the dizziness begin?"     1 week ago, seen in ED 3/23 for LOC 6. AGGRAVATING FACTORS: "Does anything make it worse?" (e.g., standing, change in head position)     Comes and goes, "sometimes when I'm walking" 7. HEART RATE: "Can you tell me your heart rate?" "How many beats in 15 seconds?"  (Note: not all patients can do this)       unsure 8. CAUSE: "What do you think is causing the dizziness?"     unsure 9. RECURRENT SYMPTOM: "Have you had dizziness before?" If Yes, ask: "When was the last time?" "What happened that time?"     none 10. OTHER SYMPTOMS: "Do you have any other symptoms?" (e.g., fever, chest pain, vomiting, diarrhea, bleeding)  Cough, congestion  Protocols used: Dizziness - Lightheadedness-A-AH

## 2023-05-10 NOTE — Telephone Encounter (Signed)
 Reviewed. Will discuss during scheduled visit

## 2023-05-10 NOTE — Telephone Encounter (Signed)
 Requested Prescriptions  Pending Prescriptions Disp Refills   metoprolol succinate (TOPROL-XL) 25 MG 24 hr tablet [Pharmacy Med Name: METOPROLOL SUCC ER 25 MG TAB] 90 tablet 0    Sig: TAKE 1 TABLET (25 MG TOTAL) BY MOUTH DAILY.     Cardiovascular:  Beta Blockers Failed - 05/10/2023  9:18 AM      Failed - Last BP in normal range    BP Readings from Last 1 Encounters:  05/01/23 (!) 143/117         Passed - Last Heart Rate in normal range    Pulse Readings from Last 1 Encounters:  05/01/23 71         Passed - Valid encounter within last 6 months    Recent Outpatient Visits           1 month ago Type 2 diabetes mellitus in remission [facility])   [facility] Simmons-Robinson, Tawanna Cooler, MD       Future Appointments             In 4 months Simmons-Robinson, Tawanna Cooler, MD [facility]

## 2023-05-10 NOTE — Progress Notes (Signed)
 "     Established patient visit   Patient: Sheila Herrera   DOB: [birth year]   73 y.o. Female  MRN: [id number] Visit Date: 05/11/2023  Today's healthcare provider: Rockie Agent, MD   Chief Complaint  Patient presents with   Dizziness    Comes and goes, lightheaded, kids don't think she is eating enough   Sinusitis    Left Ear pain, stuffy nose, mucus drainage down back of throat, sore throat, onset monday   Subjective     HPI     Dizziness    Additional comments: Comes and goes, lightheaded, kids don't think she is eating enough        Sinusitis    Additional comments: Left Ear pain, stuffy nose, mucus drainage down back of throat, sore throat, onset monday      Last edited by Baxter Puller on 05/11/2023 10:13 AM.       Discussed the use of AI scribe software for clinical note transcription with the patient, who gave verbal consent to proceed.  History of Present Illness Sheila Herrera is a 73 year old female who presents with dizziness and loss of consciousness. She is accompanied by her granddaughter, Vernell.  She experienced an episode of dizziness and loss of consciousness while helping her family with a task outdoors. She felt hot, had not eaten or drunk enough, and subsequently felt like she was going to pass out. She lost consciousness briefly, during which she was pale and made a gurgling noise. She regained consciousness shortly after being laid on the ground. This was the first time such an event occurred, and she did not experience any shaking or confusion upon waking, although she initially spoke gibberish.  She reports ongoing dizziness that 'comes and goes' and describes it as feeling unsteady rather than the room spinning. She has also experienced a scratchy throat and ear pain, suspecting a sinus infection. She has not been driving due to concerns about dizziness. She notes that she did not feel well the previous week and has been  experiencing congestion.  Her past medical history includes well-controlled diabetes with a last A1c of 5.8, hypertension, and hypothyroidism. She is currently on hydrochlorothiazide 25 mg daily, Synthroid  75 mcg daily, metformin  500 mg twice weekly, metoprolol 25 mg daily, nitroglycerin as needed, and telmisartan 20 mg daily. She reports a weight gain of five pounds since her last visit, with a current weight of 131 pounds and a BMI of 22.49.  No headaches, vision changes, nausea, vomiting, or hearing issues. Pt reports continued dizziness      Past Medical History:  Diagnosis Date   Hypertension     Medications: Outpatient Medications Prior to Visit  Medication Sig   Alirocumab (PRALUENT) 150 MG/ML SOAJ Inject into the skin.   amLODipine  (NORVASC ) 5 MG tablet TAKE 1 TABLET (5 MG TOTAL) BY MOUTH DAILY.   aspirin 81 MG chewable tablet Chew 81 mg by mouth.   ergocalciferol  (VITAMIN D2) 1.25 MG (50000 UT) capsule Take 1 capsule once a week for 8 weeks.   levothyroxine  (SYNTHROID ) 75 MCG tablet Take 1 tablet (75 mcg total) by mouth daily.   metFORMIN  (GLUCOPHAGE ) 500 MG tablet Take 1 tablet (500 mg total) by mouth daily with breakfast. (Patient taking differently: Take 500 mg by mouth 2 (two) times a week.)   metoprolol succinate (TOPROL-XL) 25 MG 24 hr tablet TAKE 1 TABLET (25 MG TOTAL) BY MOUTH DAILY.   nystatin  (MYCOSTATIN /NYSTOP ) powder Apply 1 Application topically  3 (three) times daily. (Patient taking differently: Apply 1 Application topically once as needed.)   ondansetron  (ZOFRAN -ODT) 4 MG disintegrating tablet Take 1 tablet (4 mg total) by mouth every 8 (eight) hours as needed for nausea or vomiting.   telmisartan (MICARDIS) 20 MG tablet Take 20 mg by mouth 2 (two) times a week.   [DISCONTINUED] hydrochlorothiazide (HYDRODIURIL) 25 MG tablet hydrochlorothiazide 25 mg tablet  TAKE 1 TABLET BY MOUTH EVERY DAY   cyanocobalamin  (VITAMIN B12) 1000 MCG tablet Take 1,000 mcg by mouth  daily. (Patient not taking: Reported on 05/11/2023)   nitroGLYCERIN (NITROSTAT) 0.4 MG SL tablet  (Patient not taking: Reported on 05/11/2023)   [DISCONTINUED] estradiol  (ESTRACE ) 0.1 MG/GM vaginal cream Estrogen Cream Instruction Discard applicator Apply pea sized amount to tip of finger to urethra before bed. Wash hands well after application. Use Monday, Wednesday and Friday (Patient not taking: Reported on 05/11/2023)   [DISCONTINUED] meloxicam  (MOBIC ) 7.5 MG tablet TAKE 1 TABLET BY MOUTH EVERY DAY (Patient not taking: Reported on 05/11/2023)   No facility-administered medications prior to visit.    Review of Systems  Last CBC Lab Results  Component Value Date   WBC 4.5 05/01/2023   HGB 14.2 05/01/2023   HCT 40.4 05/01/2023   MCV 95.7 05/01/2023   MCH 33.6 05/01/2023   RDW 13.1 05/01/2023   PLT 152 05/01/2023   Last metabolic panel Lab Results  Component Value Date   GLUCOSE 113 (H) 05/01/2023   NA 138 05/01/2023   K 3.4 (L) 05/01/2023   CL 104 05/01/2023   CO2 27 05/01/2023   BUN 27 (H) 05/01/2023   CREATININE 0.90 05/01/2023   GFRNONAA >60 05/01/2023   CALCIUM 9.8 05/01/2023   PHOS 3.4 11/17/2021   PROT 7.7 08/21/2022   ALBUMIN 4.3 08/21/2022   LABGLOB 2.7 02/05/2016   AGRATIO 1.5 02/05/2016   BILITOT 1.0 08/21/2022   ALKPHOS 45 08/21/2022   AST 17 08/21/2022   ALT 14 08/21/2022   ANIONGAP 7 05/01/2023   Last lipids Lab Results  Component Value Date   CHOL 152 03/16/2023   HDL 61 03/16/2023   LDLCALC 68 03/16/2023   TRIG 131 03/16/2023   CHOLHDL 2.5 03/16/2023   Last hemoglobin A1c Lab Results  Component Value Date   HGBA1C 5.8 (A) 03/16/2023   Last thyroid  functions Lab Results  Component Value Date   TSH 1.600 03/16/2023   T4TOTAL 7.4 02/05/2016   Last vitamin D  No results found for: 25OHVITD2, 25OHVITD3, VD25OH Last vitamin B12 and Folate Lab Results  Component Value Date   VITAMINB12 >2000 (H) 03/16/2023        Objective    BP 124/70  (Cuff Size: Normal)   Pulse 67   Temp 98.6 F (37 C)   Resp 16   Ht 5' 4 (1.626 m)   Wt 131 lb (59.4 kg)   SpO2 99%   BMI 22.49 kg/m  BP Readings from Last 3 Encounters:  05/11/23 124/70  05/01/23 (!) 143/117  03/16/23 124/73   Wt Readings from Last 3 Encounters:  05/11/23 131 lb (59.4 kg)  05/01/23 126 lb (57.2 kg)  03/16/23 129 lb 11.2 oz (58.8 kg)        Physical Exam Vitals reviewed.  Constitutional:      General: She is not in acute distress.    Appearance: Normal appearance. She is not ill-appearing, toxic-appearing or diaphoretic.  HENT:     Right Ear: No tenderness. There is impacted cerumen. Tympanic membrane is  not erythematous.     Left Ear: Tenderness present. A middle ear effusion is present. Tympanic membrane is bulging. Tympanic membrane is not erythematous.     Mouth/Throat:     Mouth: Mucous membranes are moist.     Pharynx: Oropharynx is clear. No oropharyngeal exudate, posterior oropharyngeal erythema or postnasal drip.  Eyes:     Conjunctiva/sclera: Conjunctivae normal.  Cardiovascular:     Rate and Rhythm: Normal rate and regular rhythm.     Pulses: Normal pulses.     Heart sounds: Normal heart sounds. No murmur heard.    No friction rub. No gallop.  Pulmonary:     Effort: Pulmonary effort is normal. No respiratory distress.     Breath sounds: Normal breath sounds. No stridor. No wheezing, rhonchi or rales.  Abdominal:     General: Bowel sounds are normal. There is no distension.     Palpations: Abdomen is soft.     Tenderness: There is no abdominal tenderness.  Musculoskeletal:     Right lower leg: No edema.     Left lower leg: No edema.  Skin:    Findings: No erythema or rash.  Neurological:     Mental Status: She is alert and oriented to person, place, and time.  Psychiatric:        Mood and Affect: Mood and affect normal.        Speech: Speech normal.        Behavior: Behavior normal. Behavior is cooperative.       No results  found for any visits on 05/11/23.  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 There was no shaking, confusion, or seizure-like activity. Intermittent dizziness persists, characterized by unsteadiness rather than vertigo. Differential diagnosis includes dehydration, orthostatic hypotension, and possible inner ear issues. Blood pressure was elevated in the ED but is currently well-controlled. The impact of medications, particularly hydrochlorothiazide, on blood pressure and hydration status is being considered. - Hold hydrochlorothiazide 25 mg daily and monitor blood pressure. - Check blood pressure at home regularly. - Schedule a virtual follow-up visit in 2-3 weeks to reassess blood pressure and dizziness. - Continue amlodipine  and metoprolol as prescribed.  Hypertension Blood pressure was elevated in the ED but is currently well-controlled at 124/70 mmHg. She is on multiple antihypertensive medications, including amlodipine , metoprolol, and telmisartan. The impact of hydrochlorothiazide on blood pressure and hydration status is being considered. - Hold hydrochlorothiazide 25 mg daily and monitor blood pressure. - Continue amlodipine  5mg  daily, telmisartan 20mg  twice weekly and metoprolol 25mg  BID as prescribed. - Check blood pressure at home regularly.  Sinus Congestion She reports a scratchy throat and ear pain, suggesting possible sinus congestion. Examination revealed congestion and bulging of the left eardrum, consistent with sinus infection. There  is no erythema or exudate in the oropharynx. Symptoms have been present for about a week, coinciding with pollen exposure. - Prescribe amoxicillin  875 mg twice daily for 5 days for sinus infection. -  Monitor symptoms and reassess if symptoms persist or worsen.  Diabetes Mellitus Diabetes is well-controlled with a recent A1c of 5.8%. She is on metformin  500 mg twice weekly. There is no indication of hypoglycemia contributing to the dizziness. - Continue metformin  500 mg twice weekly. - Monitor blood glucose levels regularly.  General Health Maintenance Weight is stable, and BMI is within a healthy range. Emphasized the importance of adequate hydration and nutrition, especially in the context of the recent dizziness episode. - Encourage adequate hydration and nutrition.  Dizziness  Negative orthostatic vitals    Return in about 2 weeks (around 05/25/2023) for Dizziness, HTN.         Rockie Agent, MD  [facility] [phone] (phone) [phone] (fax)  [facility] "

## 2023-05-11 ENCOUNTER — Ambulatory Visit (INDEPENDENT_AMBULATORY_CARE_PROVIDER_SITE_OTHER): Admitting: Family Medicine

## 2023-05-11 ENCOUNTER — Encounter: Payer: Self-pay | Admitting: Family Medicine

## 2023-05-11 VITALS — BP 124/70 | HR 67 | Temp 98.6°F | Resp 16 | Ht 64.0 in | Wt 131.0 lb

## 2023-05-11 DIAGNOSIS — J0100 Acute maxillary sinusitis, unspecified: Secondary | ICD-10-CM

## 2023-05-11 DIAGNOSIS — I10 Essential (primary) hypertension: Secondary | ICD-10-CM | POA: Diagnosis not present

## 2023-05-11 DIAGNOSIS — R42 Dizziness and giddiness: Secondary | ICD-10-CM

## 2023-05-11 MED ORDER — AMOXICILLIN 875 MG PO TABS: 875.0000 mg | ORAL_TABLET | Freq: Two times a day (BID) | ORAL | 0 refills | Status: AC

## 2023-05-11 NOTE — Patient Instructions (Addendum)
 VISIT SUMMARY:  During your visit, we discussed your recent episode of dizziness and loss of consciousness, as well as your ongoing symptoms of dizziness, scratchy throat, and ear pain. We reviewed your current medications and overall health status, including your well-controlled diabetes and hypertension.  YOUR PLAN:  -DIZZINESS AND SYNCOPE: You experienced dizziness and briefly lost consciousness, likely due to dehydration, not eating enough, and possible exposure to paint fumes. We will stop your hydrochlorothiazide for now and monitor your blood pressure at home. Please check your blood pressure regularly and we will have a virtual follow-up visit in 2-3 weeks to reassess your condition.  -HYPERTENSION: Your blood pressure was high in the emergency room but is now well-controlled. We will stop your hydrochlorothiazide and continue your other blood pressure medications. Please monitor your blood pressure at home regularly.  -SINUS infection: You have symptoms of a sinus infection, including a scratchy throat and ear pain. We will treat this with amoxicillin 875 mg twice daily for 5 days. Please monitor your symptoms and let us know if they do not improve or get worse.  -DIABETES MELLITUS: Your diabetes is well-controlled with your current medication, metformin. Please continue taking metformin 500 mg twice weekly and monitor your blood glucose levels regularly.  -GENERAL HEALTH MAINTENANCE: Your weight and BMI are within a healthy range. It is important to stay hydrated and maintain good nutrition, especially given your recent dizziness episode.  INSTRUCTIONS:  Please check your blood pressure at home regularly and keep a record of the readings. We will have a virtual follow-up visit in 2-3 weeks to reassess your blood pressure and dizziness. If your sinus symptoms do not improve or worsen, please contact us for further evaluation.

## 2023-05-26 ENCOUNTER — Telehealth (INDEPENDENT_AMBULATORY_CARE_PROVIDER_SITE_OTHER): Admitting: Family Medicine

## 2023-05-26 DIAGNOSIS — I10 Essential (primary) hypertension: Secondary | ICD-10-CM

## 2023-05-26 DIAGNOSIS — E038 Other specified hypothyroidism: Secondary | ICD-10-CM

## 2023-05-26 DIAGNOSIS — R42 Dizziness and giddiness: Secondary | ICD-10-CM

## 2023-05-26 MED ORDER — LEVOTHYROXINE SODIUM 50 MCG PO TABS: 50.0000 ug | ORAL_TABLET | Freq: Every day | ORAL | 1 refills | Status: DC

## 2023-05-26 NOTE — Assessment & Plan Note (Signed)
 Chronic  Stable  Lab Results  Component Value Date   TSH 1.600 03/16/2023   T4 was 2.02  Since dizziness symptoms seem to be timed around patient's dose of synthroid, recommend decreasing dose to 50mcg daily to see if symptoms improve

## 2023-05-26 NOTE — Assessment & Plan Note (Addendum)
 Chronic Hypertension well controlled with current medications. Blood pressure today was 126/68 mmHg.  - Continue amlodipine 5 mg daily - continue metoprolol 25mg  daily & telmisartan 20mg  BID  - f/u with cardiology as previously scheduled

## 2023-05-26 NOTE — Progress Notes (Signed)
 MyChart Video Visit    Virtual Visit via Video Note   This format is felt to be most appropriate for this patient at this time. Physical exam was limited by quality of the video and audio technology used for the visit.   Patient location: Patient's home address   Provider location: [facility]  [address]  [place], [place] [address]   I discussed the limitations of evaluation and management by telemedicine and the availability of in person appointments. The patient expressed understanding and agreed to proceed.  Patient: Sheila Herrera   DOB: 23-Apr-1950   73 y.o. Female  MRN: [id number] Visit Date: 05/26/2023  Today's healthcare provider: Ronnald Ramp, MD   Chief Complaint  Patient presents with   Dizziness   Hypertension   Subjective    HPI   Discussed the use of AI scribe software for clinical note transcription with the patient, who gave verbal consent to proceed.  History of Present Illness Sheila Herrera is a 73 year old female with hypertension who presents with dizziness.  She experiences dizziness that worsens in the morning upon getting up and gradually improves throughout the day, with minimal symptoms at night. The dizziness is absent when she gets up at night to use the bathroom. She has a history of seeing an ear, nose, and throat doctor two years ago, where tests ruled out vertigo, and she was found to have a vitamin deficiency, which improved with vitamin supplementation. She is no longer taking B12 but is considering a B complex vitamin.  She has a history of hypertension and is currently taking metoprolol 25 mg daily, amlodipine 5 mg daily, and telmisartan 20 mg twice a day. She was advised to stop hydrochlorothiazide previously, and her blood pressure readings have been stable, with a recent reading of 126/68 mmHg. Her heart rate was noted to be in the 50s.  She takes her thyroid medication, Synthroid, at 6  AM and is concerned it may be contributing to her dizziness. Her thyroid levels were checked in February and were normal, but she has experienced weight changes.  She reports a persistent sore throat and ear discomfort, which were treated with antibiotics for sinusitis two weeks ago, but symptoms persist. She has a history of seeing a neurologist two years ago for similar symptoms and was advised to take vitamins, which improved her condition at that time.      Past Medical History:  Diagnosis Date   Hypertension     Medications: Outpatient Medications Prior to Visit  Medication Sig   Alirocumab (PRALUENT) 150 MG/ML SOAJ Inject into the skin.   amLODipine (NORVASC) 5 MG tablet TAKE 1 TABLET (5 MG TOTAL) BY MOUTH DAILY.   aspirin 81 MG chewable tablet Chew 81 mg by mouth.   cyanocobalamin (VITAMIN B12) 1000 MCG tablet Take 1,000 mcg by mouth daily. (Patient not taking: Reported on 05/11/2023)   ergocalciferol (VITAMIN D2) 1.25 MG (50000 UT) capsule Take 1 capsule once a week for 8 weeks.   metFORMIN (GLUCOPHAGE) 500 MG tablet Take 1 tablet (500 mg total) by mouth daily with breakfast. (Patient taking differently: Take 500 mg by mouth 2 (two) times a week.)   metoprolol succinate (TOPROL-XL) 25 MG 24 hr tablet TAKE 1 TABLET (25 MG TOTAL) BY MOUTH DAILY.   nitroGLYCERIN (NITROSTAT) 0.4 MG SL tablet  (Patient not taking: Reported on 05/11/2023)   nystatin (MYCOSTATIN/NYSTOP) powder Apply 1 Application topically 3 (three) times daily. (Patient taking  differently: Apply 1 Application topically once as needed.)   ondansetron (ZOFRAN-ODT) 4 MG disintegrating tablet Take 1 tablet (4 mg total) by mouth every 8 (eight) hours as needed for nausea or vomiting.   telmisartan (MICARDIS) 20 MG tablet Take 20 mg by mouth 2 (two) times a week.   [DISCONTINUED] levothyroxine (SYNTHROID) 75 MCG tablet Take 1 tablet (75 mcg total) by mouth daily.   No facility-administered medications prior to visit.     Review of Systems  HENT:  Positive for sinus pain.     Last metabolic panel Lab Results  Component Value Date   GLUCOSE 113 (H) 05/01/2023   NA 138 05/01/2023   K 3.4 (L) 05/01/2023   CL 104 05/01/2023   CO2 27 05/01/2023   BUN 27 (H) 05/01/2023   CREATININE 0.90 05/01/2023   GFRNONAA >60 05/01/2023   CALCIUM 9.8 05/01/2023   PHOS 3.4 11/17/2021   PROT 7.7 08/21/2022   ALBUMIN 4.3 08/21/2022   LABGLOB 2.7 02/05/2016   AGRATIO 1.5 02/05/2016   BILITOT 1.0 08/21/2022   ALKPHOS 45 08/21/2022   AST 17 08/21/2022   ALT 14 08/21/2022   ANIONGAP 7 05/01/2023   Last lipids Lab Results  Component Value Date   CHOL 152 03/16/2023   HDL 61 03/16/2023   LDLCALC 68 03/16/2023   TRIG 131 03/16/2023   CHOLHDL 2.5 03/16/2023   Last hemoglobin A1c Lab Results  Component Value Date   HGBA1C 5.8 (A) 03/16/2023   Last thyroid functions Lab Results  Component Value Date   TSH 1.600 03/16/2023   T4TOTAL 7.4 02/05/2016   Lab Results  Component Value Date   VITAMINB12 >2000 (H) 03/16/2023         Objective    There were no vitals taken for this visit.  BP Readings from Last 3 Encounters:  05/11/23 124/70  05/01/23 (!) 143/117  03/16/23 124/73   Wt Readings from Last 3 Encounters:  05/11/23 131 lb (59.4 kg)  05/01/23 126 lb (57.2 kg)  03/16/23 129 lb 11.2 oz (58.8 kg)        Physical Exam Constitutional:      General: She is not in acute distress.    Appearance: Normal appearance. She is not ill-appearing.  Pulmonary:     Effort: Pulmonary effort is normal. No respiratory distress.  Neurological:     Mental Status: She is alert and oriented to person, place, and time.        Assessment & Plan     Problem List Items Addressed This Visit       Cardiovascular and Mediastinum   HTN (hypertension)   Chronic Hypertension well controlled with current medications. Blood pressure today was 126/68 mmHg.  - Continue amlodipine 5 mg daily - continue  metoprolol 25mg  daily & telmisartan 20mg  BID  - f/u with cardiology as previously scheduled         Endocrine   Hypothyroidism   Chronic  Stable  Lab Results  Component Value Date   TSH 1.600 03/16/2023   T4 was 2.02  Since dizziness symptoms seem to be timed around patient's dose of synthroid, recommend decreasing dose to daily to see if symptoms improve        Relevant Medications   levothyroxine (SYNTHROID) 50 MCG tablet   Other Visit Diagnoses       Dizziness    -  Primary      Assessment & Plan Dizziness Dizziness occurs primarily in the morning when transitioning from  lying down to sitting up, suggesting a positional component. ENT evaluation ruled out vertigo. Vitamin deficiency previously improved dizziness. Current symptoms may relate to thyroid medication dosage, aligning with Synthroid administration. Differential includes benign paroxysmal positional vertigo, sinus issues, or thyroid medication effects. - Reduce Synthroid to 50 mcg daily and monitor symptoms - Consider over-the-counter B complex if symptoms persist - Order brain MRI if dizziness does not improve - Re-evaluate in one month  Sore throat and ear pain Persistent sore throat and ear pain despite previous antibiotic treatment for sinusitis two weeks ago  ENT evaluation is recommended to assess for underlying nasal or throat issues. Further ENT evaluation may be necessary if symptoms persist after thyroid medication adjustment. - Consider referral to ENT if symptoms persist after thyroid medication adjustment, pt prefers to hold off on referral for now noting ENT evaluation was completed two years ago        Return in about 4 weeks (around 06/23/2023).     I discussed the assessment and treatment plan with the patient. The patient was provided an opportunity to ask questions and all were answered. The patient agreed with the plan and demonstrated an understanding of the instructions.   The  patient was advised to call back or seek an in-person evaluation if the symptoms worsen or if the condition fails to improve as anticipated.  I provided 15 minutes of non-face-to-face time during this encounter.   Mimi Alt, MD [facility] [phone] (phone) [phone] (fax)  [facility]

## 2023-05-30 ENCOUNTER — Ambulatory Visit (INDEPENDENT_AMBULATORY_CARE_PROVIDER_SITE_OTHER): Payer: Medicare HMO

## 2023-05-30 VITALS — BP 124/63 | HR 56 | Ht 64.0 in | Wt 128.0 lb

## 2023-05-30 DIAGNOSIS — Z1159 Encounter for screening for other viral diseases: Secondary | ICD-10-CM

## 2023-05-30 DIAGNOSIS — Z0000 Encounter for general adult medical examination without abnormal findings: Secondary | ICD-10-CM

## 2023-05-30 NOTE — Progress Notes (Signed)
 Because this visit was a virtual/telehealth visit,  certain criteria was not obtained, such a blood pressure, CBG if applicable, and timed get up and go. Any medications not marked as "taking" were not mentioned during the medication reconciliation part of the visit. Any vitals not documented were not able to be obtained due to this being a telehealth visit or patient was unable to self-report a recent blood pressure reading due to a lack of equipment at home via telehealth. Vitals that have been documented are verbally provided by the patient.  Subjective:   Sheila Herrera is a 73 y.o. who presents for a Medicare Wellness preventive visit.  Visit Complete: Virtual I connected with  Sheila Herrera on 05/30/23 by a audio enabled telemedicine application and verified that I am speaking with the correct person using two identifiers.  Patient Location: Home  Provider Location: Home Office  I discussed the limitations of evaluation and management by telemedicine. The patient expressed understanding and agreed to proceed.  Vital Signs: Because this visit was a virtual/telehealth visit, some criteria may be missing or patient reported. Any vitals not documented were not able to be obtained and vitals that have been documented are patient reported.  VideoDeclined- This patient declined Librarian, academic. Therefore the visit was completed with audio only.  Persons Participating in Visit: Patient.  AWV Questionnaire: Yes: Patient Medicare AWV questionnaire was completed by the patient on 05/30/2023; I have confirmed that all information answered by patient is correct and no changes since this date.  Cardiac Risk Factors include: advanced age (>7men, >20 women);hypertension     Objective:    Today's Vitals   05/30/23 0940  BP: 124/63  Pulse: (!) 56  Weight: 128 lb (58.1 kg)  Height: 5\' 4"  (1.626 m)   Body mass index is 21.97 kg/m.     05/30/2023     9:45 AM 05/01/2023    4:29 PM 08/21/2022   10:09 AM 05/25/2022   10:33 AM 06/03/2021    8:58 AM 05/20/2021   11:04 AM  Advanced Directives  Does Patient Have a Medical Advance Directive? No No No No No No  Would patient like information on creating a medical advance directive? No - Patient declined     No - Patient declined    Current Medications (verified) Outpatient Encounter Medications as of 05/30/2023  Medication Sig   Alirocumab (PRALUENT) 150 MG/ML SOAJ Inject into the skin.   amLODipine (NORVASC) 5 MG tablet TAKE 1 TABLET (5 MG TOTAL) BY MOUTH DAILY.   aspirin 81 MG chewable tablet Chew 81 mg by mouth.   ergocalciferol (VITAMIN D2) 1.25 MG (50000 UT) capsule Take 1 capsule once a week for 8 weeks.   levothyroxine  (SYNTHROID ) 50 MCG tablet Take 1 tablet (50 mcg total) by mouth daily.   metFORMIN  (GLUCOPHAGE ) 500 MG tablet Take 1 tablet (500 mg total) by mouth daily with breakfast. (Patient taking differently: Take 500 mg by mouth 2 (two) times a week.)   metoprolol succinate (TOPROL-XL) 25 MG 24 hr tablet TAKE 1 TABLET (25 MG TOTAL) BY MOUTH DAILY.   nystatin  (MYCOSTATIN /NYSTOP ) powder Apply 1 Application topically 3 (three) times daily. (Patient taking differently: Apply 1 Application topically once as needed.)   ondansetron  (ZOFRAN -ODT) 4 MG disintegrating tablet Take 1 tablet (4 mg total) by mouth every 8 (eight) hours as needed for nausea or vomiting.   telmisartan (MICARDIS) 20 MG tablet Take 20 mg by mouth 2 (two) times a week.  cyanocobalamin  (VITAMIN B12) 1000 MCG tablet Take 1,000 mcg by mouth daily. (Patient not taking: Reported on 05/30/2023)   nitroGLYCERIN (NITROSTAT) 0.4 MG SL tablet  (Patient not taking: Reported on 05/30/2023)   No facility-administered encounter medications on file as of 05/30/2023.    Allergies (verified) Patient has no known allergies.   History: Past Medical History:  Diagnosis Date   Hypertension    Past Surgical History:  Procedure  Laterality Date   CARDIAC SURGERY     open heart surgery 1958   CORONARY STENT PLACEMENT     VEIN SURGERY     vein graph in leg due to open heart surgery, 1966   Family History  Problem Relation Age of Onset   Arthritis Mother    Cancer Father        lung   Heart disease Sister        quadriple by pass   Breast cancer Paternal Aunt    Breast cancer Paternal Aunt    Heart disease Brother    OCD Son    Social History   Socioeconomic History   Marital status: Married    Spouse name: Not on file   Number of children: Not on file   Years of education: Not on file   Highest education level: 12th grade  Occupational History   Not on file  Tobacco Use   Smoking status: Never   Smokeless tobacco: Never  Vaping Use   Vaping status: Never Used  Substance and Sexual Activity   Alcohol use: No   Drug use: No   Sexual activity: Yes    Birth control/protection: Post-menopausal  Other Topics Concern   Not on file  Social History Narrative   Not on file   Social Drivers of Health   Financial Resource Strain: Low Risk  (05/26/2023)   Overall Financial Resource Strain (CARDIA)    Difficulty of Paying Living Expenses: Not hard at all  Food Insecurity: No Food Insecurity (05/26/2023)   Hunger Vital Sign    Worried About Running Out of Food in the Last Year: Never true    Ran Out of Food in the Last Year: Never true  Transportation Needs: No Transportation Needs (05/26/2023)   PRAPARE - Administrator, Civil Service (Medical): No    Lack of Transportation (Non-Medical): No  Physical Activity: Insufficiently Active (05/26/2023)   Exercise Vital Sign    Days of Exercise per Week: 1 day    Minutes of Exercise per Session: 20 min  Stress: No Stress Concern Present (05/26/2023)   [employer] of Occupational Health - Occupational Stress Questionnaire    Feeling of Stress : Not at all  Social Connections: Socially Integrated (05/26/2023)   Social Connection and  Isolation Panel [NHANES]    Frequency of Communication with Friends and Family: More than three times a week    Frequency of Social Gatherings with Friends and Family: More than three times a week    Attends Religious Services: More than 4 times per year    Active Member of [employer] or Organizations: No    Attends Engineer, structural: More than 4 times per year    Marital Status: Married    Tobacco Counseling Counseling given: Yes    Clinical Intake:  Pre-visit preparation completed: Yes  Pain : No/denies pain     BMI - recorded: 21.97 Nutritional Status: BMI of 19-24  Normal Nutritional Risks: None Diabetes: No (per patient she is prediabetic)  Lab  Results  Component Value Date   HGBA1C 5.8 (A) 03/16/2023   HGBA1C 5.8 (A) 09/15/2022   HGBA1C 5.6 03/10/2022     How often do you need to have someone help you when you read instructions, pamphlets, or other written materials from your doctor or pharmacy?: 1 - Never  Interpreter Needed?: No  Information entered by :: Sheila Herrera CMA   Activities of Daily Living     05/30/2023    9:45 AM 06/29/2022   10:50 AM  In your present state of health, do you have any difficulty performing the following activities:  Hearing? 0 0  Vision? 0 0  Difficulty concentrating or making decisions? 0 0  Walking or climbing stairs? 0 0  Dressing or bathing? 0 0  Doing errands, shopping? 0 0  Preparing Food and eating ? N   Using the Toilet? N   In the past six months, have you accidently leaked urine? N   Do you have problems with loss of bowel control? N   Managing your Medications? N   Managing your Finances? N   Housekeeping or managing your Housekeeping? N     Patient Care Team: Mimi Alt, MD as PCP - General (Family Medicine) Enriqueta Harvey, RN as Case Manager (General Practice) Pa, [facility]  Indicate any recent Medical Services you may have received from other than [facility] providers in the past  year (date may be approximate).     Assessment:   This is a routine wellness examination for Sheila Herrera.  Hearing/Vision screen Hearing Screening - Comments:: Patient denies any hearing difficulties.   Vision Screening - Comments:: Patient wears one contact in her RT eye and wears glasses for distance vision vision. Schedule to see Dr. Cleora Daft at [facility] on Wednesday 06/01/2023   Goals Addressed             This Visit's Progress    Patient Stated       To go to the beach more        Depression Screen     05/30/2023    9:48 AM 05/11/2023   10:38 AM 03/16/2023    1:23 PM 09/15/2022    1:29 PM 08/18/2022   10:04 AM 06/29/2022   10:50 AM 05/25/2022   10:29 AM  PHQ 2/9 Scores  PHQ - 2 Score 0 0 0 0 0 0 0  PHQ- 9 Score 0 0 0 0 0 0     Fall Risk     05/30/2023    9:45 AM 05/11/2023   10:36 AM 03/16/2023    1:24 PM 09/15/2022    1:29 PM 08/18/2022   10:04 AM  Fall Risk   Falls in the past year? 0 1 0 0 0  Comment  fainted but son caught her     Number falls in past yr: 0 0 0 0 0  Injury with Fall? 0 0 0 0 0  Risk for fall due to : No Fall Risks   No Fall Risks No Fall Risks  Follow up Falls prevention discussed;Falls evaluation completed;Education provided   Falls evaluation completed Falls evaluation completed    MEDICARE RISK AT HOME:  Medicare Risk at Home Any stairs in or around the home?: Yes If so, are there any without handrails?: No Home free of loose throw rugs in walkways, pet beds, electrical cords, etc?: Yes Adequate lighting in your home to reduce risk of falls?: Yes Life alert?: No Use of a  cane, walker or w/c?: No Grab bars in the bathroom?: No Shower chair or bench in shower?: No Elevated toilet seat or a handicapped toilet?: No  TIMED UP AND GO:  Was the test performed?  No  Cognitive Function: 6CIT completed        05/30/2023    9:41 AM 05/25/2022   10:48 AM  6CIT Screen  What Year? 0 points 0 points  What month? 0 points 0 points  What  time? 0 points 0 points  Count back from 20 0 points 0 points  Months in reverse 0 points 0 points  Repeat phrase 0 points 0 points  Total Score 0 points 0 points    Immunizations Immunization History  Administered Date(s) Administered   Fluad Quad(high Dose 65+) 11/26/2021   Influenza,inj,Quad PF,6+ Mos 12/29/2017, 11/26/2020   Tdap 05/14/2015   Zoster, Live 05/14/2015    Screening Tests Health Maintenance  Topic Date Due   COVID-19 Vaccine (1) Never done   Hepatitis C Screening  Never done   Pneumonia Vaccine 42+ Years old (1 of 2 - PCV) Never done   Zoster Vaccines- Shingrix (1 of 2) 04/06/1971   Colonoscopy  Never done   INFLUENZA VACCINE  09/09/2023   Diabetic kidney evaluation - Urine ACR  09/15/2023   MAMMOGRAM  12/13/2023   Diabetic kidney evaluation - eGFR measurement  04/30/2024   Medicare Annual Wellness (AWV)  05/29/2024   DEXA SCAN  12/12/2024   DTaP/Tdap/Td (2 - Td or Tdap) 05/13/2025   HPV VACCINES  Aged Out   Meningococcal B Vaccine  Aged Out    Health Maintenance  Health Maintenance Due  Topic Date Due   COVID-19 Vaccine (1) Never done   Hepatitis C Screening  Never done   Pneumonia Vaccine 47+ Years old (1 of 2 - PCV) Never done   Zoster Vaccines- Shingrix (1 of 2) 04/06/1971   Colonoscopy  Never done   Health Maintenance Items Addressed: Labs Ordered:, Hepatitis C Screening  Additional Screening:  Vision Screening: Recommended annual ophthalmology exams for early detection of glaucoma and other disorders of the eye.  Dental Screening: Recommended annual dental exams for proper oral hygiene  Community Resource Referral / Chronic Care Management: CRR required this visit?  No   CCM required this visit?  No     Plan:     I have personally reviewed and noted the following in the patient's chart:   Medical and social history Use of alcohol, tobacco or illicit drugs  Current medications and supplements including opioid prescriptions.  Patient is not currently taking opioid prescriptions. Functional ability and status Nutritional status Physical activity Advanced directives List of other physicians Hospitalizations, surgeries, and ER visits in previous 12 months Vitals Screenings to include cognitive, depression, and falls Referrals and appointments  In addition, I have reviewed and discussed with patient certain preventive protocols, quality metrics, and best practice recommendations. A written personalized care plan for preventive services as well as general preventive health recommendations were provided to patient.      Mclain Freer, CMA   05/30/2023   After Visit Summary: (MyChart) Due to this being a telephonic visit, the after visit summary with patients personalized plan was offered to patient via MyChart   Notes: Nothing significant to report at this time.

## 2023-05-30 NOTE — Patient Instructions (Signed)
 Sheila Herrera , Thank you for taking time to come for your Medicare Wellness Visit. I appreciate your ongoing commitment to your health goals. Please review the following plan we discussed and let me know if I can assist you in the future.   Referrals/Orders/Follow-Ups/Clinician Recommendations:  Next Medicare AWV: May 30, 2024 at 3:50 pm video visit.   Aim for 30 minutes of exercise or brisk walking, 6-8 glasses of water, and 5 servings of fruits and vegetables each day.  A Hepatitis C Screening has been ordered for you today. You do not have to fast to have this lab drawn.   This is a list of the screening recommended for you and due dates:  Health Maintenance  Topic Date Due   COVID-19 Vaccine (1) Never done   Hepatitis C Screening  Never done   Pneumonia Vaccine (1 of 2 - PCV) Never done   Zoster (Shingles) Vaccine (1 of 2) 04/06/1969   Colon Cancer Screening  Never done   Flu Shot  09/09/2023   Yearly kidney health urinalysis for diabetes  09/15/2023   Mammogram  12/13/2023   Yearly kidney function blood test for diabetes  04/30/2024   Medicare Annual Wellness Visit  05/29/2024   DEXA scan (bone density measurement)  12/12/2024   DTaP/Tdap/Td vaccine (2 - Td or Tdap) 05/13/2025   HPV Vaccine  Aged Out   Meningitis B Vaccine  Aged Out    Advanced directives: (Declined) Advance directive discussed with you today. Even though you declined this today, please call our office should you change your mind, and we can give you the proper paperwork for you to fill out. Advance Care Planning is important because it:  [x]  Makes sure you receive the medical care that is consistent with your values, goals, and preferences  [x]  It provides guidance to your family and loved ones and it also reduces their decisional burden about whether or not they are making the right decisions based on what you want done  Follow the link provided in your after visit summary or read over the paperwork we  have mailed to you to help you started getting your Advance Directives in place. If you need assistance in completing these, please reach out to us  so that we can help you!   Next Medicare Annual Wellness Visit scheduled for next year: yes  Understanding Your Risk for Falls Millions of people have serious injuries from falls each year. It is important to understand your risk of falling. Talk with your health care provider about your risk and what you can do to lower it. If you do have a serious fall, make sure to tell your provider. Falling once raises your risk of falling again. How can falls affect me? Serious injuries from falls are common. These include: Broken bones, such as hip fractures. Head injuries, such as traumatic brain injuries (TBI) or concussions. A fear of falling can cause you to avoid activities and stay at home. This can make your muscles weaker and raise your risk for a fall. What can increase my risk? There are a number of risk factors that increase your risk for falling. The more risk factors you have, the higher your risk of falling. Serious injuries from a fall happen most often to people who are older than 71 years old. Teenagers and young adults ages 60-29 are also at higher risk. Common risk factors include: Weakness in the lower body. Being generally weak or confused due to long-term (chronic) illness.  Dizziness or balance problems. Poor vision. Medicines that cause dizziness or drowsiness. These may include: Medicines for your blood pressure, heart, anxiety, insomnia, or swelling (edema). Pain medicines. Muscle relaxants. Other risk factors include: Drinking alcohol. Having had a fall in the past. Having foot pain or wearing improper footwear. Working at a dangerous job. Having any of the following in your home: Tripping hazards, such as floor clutter or loose rugs. Poor lighting. Pets. Having dementia or memory loss. What actions can I take to lower my  risk of falling?     Physical activity Stay physically fit. Do strength and balance exercises. Consider taking a regular class to build strength and balance. Yoga and tai chi are good options. Vision Have your eyes checked every year and your prescription for glasses or contacts updated as needed. Shoes and walking aids Wear non-skid shoes. Wear shoes that have rubber soles and low heels. Do not wear high heels. Do not walk around the house in socks or slippers. Use a cane or walker as told by your provider. Home safety Attach secure railings on both sides of your stairs. Install grab bars for your bathtub, shower, and toilet. Use a non-skid mat in your bathtub or shower. Attach bath mats securely with double-sided, non-slip rug tape. Use good lighting in all rooms. Keep a flashlight near your bed. Make sure there is a clear path from your bed to the bathroom. Use night-lights. Do not use throw rugs. Make sure all carpeting is taped or tacked down securely. Remove all clutter from walkways and stairways, including extension cords. Repair uneven or broken steps and floors. Avoid walking on icy or slippery surfaces. Walk on the grass instead of on icy or slick sidewalks. Use ice melter to get rid of ice on walkways in the winter. Use a cordless phone. Questions to ask your health care provider Can you help me check my risk for a fall? Do any of my medicines make me more likely to fall? Should I take a vitamin D supplement? What exercises can I do to improve my strength and balance? Should I make an appointment to have my vision checked? Do I need a bone density test to check for weak bones (osteoporosis)? Would it help to use a cane or a walker? Where to find more information Centers for Disease Control and Prevention, STEADI: TonerPromos.no Community-Based Fall Prevention Programs: TonerPromos.no [employer] on Aging: BaseRingTones.pl Contact a health care provider if: You fall at home. You  are afraid of falling at home. You feel weak, drowsy, or dizzy. This information is not intended to replace advice given to you by your health care provider. Make sure you discuss any questions you have with your health care provider. Document Revised: 09/28/2021 Document Reviewed: 09/28/2021 Elsevier Patient Education  2024 [employer].

## 2023-06-01 DIAGNOSIS — E119 Type 2 diabetes mellitus without complications: Secondary | ICD-10-CM | POA: Diagnosis not present

## 2023-06-01 DIAGNOSIS — H524 Presbyopia: Secondary | ICD-10-CM | POA: Diagnosis not present

## 2023-06-12 DIAGNOSIS — I259 Chronic ischemic heart disease, unspecified: Principal | ICD-10-CM

## 2023-06-12 DIAGNOSIS — I240 Acute coronary thrombosis not resulting in myocardial infarction: Principal | ICD-10-CM

## 2023-06-12 MED ORDER — PRALUENT PEN 150 MG/ML SUBCUTANEOUS PEN INJECTOR
3 refills | 0.00000 days
Start: 2023-06-12 — End: ?

## 2023-06-13 MED ORDER — PRALUENT PEN 150 MG/ML SUBCUTANEOUS PEN INJECTOR
SUBCUTANEOUS | 3 refills | 0.00000 days | Status: CP
Start: 2023-06-13 — End: ?

## 2023-08-04 DIAGNOSIS — D696 Thrombocytopenia, unspecified: Secondary | ICD-10-CM | POA: Diagnosis not present

## 2023-08-04 DIAGNOSIS — R718 Other abnormality of red blood cells: Secondary | ICD-10-CM | POA: Diagnosis not present

## 2023-08-04 DIAGNOSIS — I10 Essential (primary) hypertension: Secondary | ICD-10-CM | POA: Diagnosis not present

## 2023-08-04 DIAGNOSIS — D582 Other hemoglobinopathies: Secondary | ICD-10-CM | POA: Diagnosis not present

## 2023-08-04 DIAGNOSIS — R42 Dizziness and giddiness: Secondary | ICD-10-CM | POA: Diagnosis not present

## 2023-08-04 DIAGNOSIS — Z955 Presence of coronary angioplasty implant and graft: Secondary | ICD-10-CM | POA: Diagnosis not present

## 2023-08-04 DIAGNOSIS — R2681 Unsteadiness on feet: Secondary | ICD-10-CM | POA: Diagnosis not present

## 2023-08-04 DIAGNOSIS — Z7982 Long term (current) use of aspirin: Secondary | ICD-10-CM | POA: Diagnosis not present

## 2023-08-04 DIAGNOSIS — I2510 Atherosclerotic heart disease of native coronary artery without angina pectoris: Secondary | ICD-10-CM | POA: Diagnosis not present

## 2023-08-05 ENCOUNTER — Other Ambulatory Visit: Payer: Self-pay | Admitting: Family Medicine

## 2023-08-05 ENCOUNTER — Emergency Department
Admit: 2023-08-05 | Discharge: 2023-08-05 | Disposition: A | Discharge status: Home / Self Care | Payer: Medicare (Managed Care) | Attending: Emergency Medicine

## 2023-08-05 DIAGNOSIS — R2681 Unsteadiness on feet: Secondary | ICD-10-CM | POA: Diagnosis not present

## 2023-08-05 DIAGNOSIS — R42 Dizziness and giddiness: Secondary | ICD-10-CM | POA: Diagnosis not present

## 2023-08-06 DIAGNOSIS — R42 Dizziness and giddiness: Secondary | ICD-10-CM | POA: Diagnosis not present

## 2023-08-08 ENCOUNTER — Encounter: Payer: Self-pay | Admitting: Family Medicine

## 2023-08-08 ENCOUNTER — Ambulatory Visit (INDEPENDENT_AMBULATORY_CARE_PROVIDER_SITE_OTHER): Admitting: Family Medicine

## 2023-08-08 VITALS — BP 117/75 | HR 78 | Ht 64.0 in | Wt 129.0 lb

## 2023-08-08 DIAGNOSIS — M5416 Radiculopathy, lumbar region: Secondary | ICD-10-CM

## 2023-08-08 DIAGNOSIS — F419 Anxiety disorder, unspecified: Secondary | ICD-10-CM | POA: Diagnosis not present

## 2023-08-08 DIAGNOSIS — D582 Other hemoglobinopathies: Secondary | ICD-10-CM

## 2023-08-08 DIAGNOSIS — R930 Abnormal findings on diagnostic imaging of skull and head, not elsewhere classified: Secondary | ICD-10-CM | POA: Diagnosis not present

## 2023-08-08 DIAGNOSIS — I10 Essential (primary) hypertension: Secondary | ICD-10-CM

## 2023-08-08 DIAGNOSIS — R42 Dizziness and giddiness: Secondary | ICD-10-CM

## 2023-08-08 DIAGNOSIS — Z09 Encounter for follow-up examination after completed treatment for conditions other than malignant neoplasm: Secondary | ICD-10-CM

## 2023-08-08 MED ORDER — BUSPIRONE HCL 7.5 MG PO TABS: 7.5000 mg | ORAL_TABLET | Freq: Two times a day (BID) | ORAL | 1 refills | Status: DC

## 2023-08-08 NOTE — Progress Notes (Signed)
 Established patient visit   Patient: Sheila Herrera   DOB: 14-Sep-1950   73 y.o. Female  MRN: [id number] Visit Date: 08/08/2023  Today's healthcare provider: Rockie Agent, MD   Chief Complaint  Patient presents with   Hospitalization Follow-up    She is feeling better, discuss lab results (elevated HBG and low platelets. They also want to consider doing further TIA workup including an echocardiogram and neck vessel imaging.    Subjective     HPI     Hospitalization Follow-up    Additional comments: She is feeling better, discuss lab results (elevated HBG and low platelets. They also want to consider doing further TIA workup including an echocardiogram and neck vessel imaging.       Last edited by Thelbert Eulalio HERO, CMA on 08/08/2023  9:39 AM.       Discussed the use of AI scribe software for clinical note transcription with the patient, who gave verbal consent to proceed.  History of Present Illness Sheila Herrera is a 73 year old female with coronary artery disease who presents for follow-up after an episode of dizziness.  She experienced an episode of dizziness characterized by a sensation of swaying to the left, which resolved upon sitting down. No lasting neurological effects were noted. A CT scan in the emergency department ruled out a stroke but showed sinus congestion. She has not experienced dizziness since adjusting the timing of her vitamin intake.  She is concerned about her elevated hemoglobin and low platelet count. Recent lab results showed a hemoglobin level of 15.8 and platelets at 132. She is currently taking iron supplements and is unsure if she should continue them. No symptoms such as excessive bleeding or unexplained bruising are present.  She has a history of coronary artery disease and had an NSTEMI in the past. She regularly follows up with a cardiologist and had a cardiac catheterization in 2017. She has not had an echocardiogram  recently and is unsure why it was recommended.  She reports significant stress due to her husband's mental health crisis and a major water leak in her house, which has increased her stress levels. She describes her stress as being at its peak and feels overwhelmed by her current circumstances.  She has a history of sinus issues, with a CT scan showing right maxillary opacification, which may represent sinusitis or a polyp. She has not experienced any drainage or sinus-related symptoms recently.     Past Medical History:  Diagnosis Date   Hypertension     Medications: Outpatient Medications Prior to Visit  Medication Sig   hydrochlorothiazide (HYDRODIURIL) 25 MG tablet Take 25 mg by mouth daily.   Alirocumab (PRALUENT) 150 MG/ML SOAJ Inject into the skin.   amLODipine (NORVASC) 5 MG tablet TAKE 1 TABLET (5 MG TOTAL) BY MOUTH DAILY.   aspirin 81 MG chewable tablet Chew 81 mg by mouth.   cyanocobalamin  (VITAMIN B12) 1000 MCG tablet Take 1,000 mcg by mouth daily. (Patient not taking: Reported on 08/08/2023)   ergocalciferol (VITAMIN D2) 1.25 MG (50000 UT) capsule Take 1 capsule once a week for 8 weeks.   levothyroxine  (SYNTHROID ) 50 MCG tablet Take 1 tablet (50 mcg total) by mouth daily.   metFORMIN  (GLUCOPHAGE ) 500 MG tablet Take 1 tablet (500 mg total) by mouth daily with breakfast. (Patient taking differently: Take 500 mg by mouth 2 (two) times a week.)   metoprolol succinate (TOPROL-XL) 25 MG 24 hr tablet TAKE 1 TABLET (25  MG TOTAL) BY MOUTH DAILY.   nitroGLYCERIN (NITROSTAT) 0.4 MG SL tablet  (Patient not taking: Reported on 08/08/2023)   nystatin  (MYCOSTATIN /NYSTOP ) powder Apply 1 Application topically 3 (three) times daily. (Patient not taking: Reported on 08/08/2023)   ondansetron  (ZOFRAN -ODT) 4 MG disintegrating tablet Take 1 tablet (4 mg total) by mouth every 8 (eight) hours as needed for nausea or vomiting.   telmisartan (MICARDIS) 20 MG tablet Take 20 mg by mouth 2 (two) times a  week.   No facility-administered medications prior to visit.    Review of Systems  Last CBC Lab Results  Component Value Date   WBC 4.5 05/01/2023   HGB 14.2 05/01/2023   HCT 40.4 05/01/2023   MCV 95.7 05/01/2023   MCH 33.6 05/01/2023   RDW 13.1 05/01/2023   PLT 152 05/01/2023   Last metabolic panel Lab Results  Component Value Date   GLUCOSE 113 (H) 05/01/2023   NA 138 05/01/2023   K 3.4 (L) 05/01/2023   CL 104 05/01/2023   CO2 27 05/01/2023   BUN 27 (H) 05/01/2023   CREATININE 0.90 05/01/2023   GFRNONAA >60 05/01/2023   CALCIUM 9.8 05/01/2023   PHOS 3.4 11/17/2021   PROT 7.7 08/21/2022   ALBUMIN 4.3 08/21/2022   LABGLOB 2.7 02/05/2016   AGRATIO 1.5 02/05/2016   BILITOT 1.0 08/21/2022   ALKPHOS 45 08/21/2022   AST 17 08/21/2022   ALT 14 08/21/2022   ANIONGAP 7 05/01/2023   Last lipids Lab Results  Component Value Date   CHOL 152 03/16/2023   HDL 61 03/16/2023   LDLCALC 68 03/16/2023   TRIG 131 03/16/2023   CHOLHDL 2.5 03/16/2023        Objective    BP 117/75   Pulse 78   Ht 5' 4 (1.626 m)   Wt 129 lb (58.5 kg)   SpO2 97%   BMI 22.14 kg/m  BP Readings from Last 3 Encounters:  08/08/23 117/75  05/30/23 124/63  05/11/23 124/70   Wt Readings from Last 3 Encounters:  08/08/23 129 lb (58.5 kg)  05/30/23 128 lb (58.1 kg)  05/11/23 131 lb (59.4 kg)        Physical Exam  Physical Exam VITALS: BP- 117/75 GENERAL: Anxious and worried appearance. CHEST: Lungs clear to auscultation. CARDIOVASCULAR: Systolic murmur present. PSYCHIATRIC: Anxious and intermittent tearful.    No results found for any visits on 08/08/23.  Assessment & Plan     Problem List Items Addressed This Visit       Cardiovascular and Mediastinum   HTN (hypertension)   Relevant Medications   hydrochlorothiazide (HYDRODIURIL) 25 MG tablet     Nervous and Auditory   Lumbar radiculopathy   Other Visit Diagnoses       Dysequilibrium    -  Primary     Hospital  discharge follow-up            Assessment & Plan Dizziness She experienced dizziness characterized by imbalance rather than true vertigo. CT scan ruled out stroke but revealed significant sinus congestion. Given her coronary artery disease and NSTEMI, further evaluation is necessary to exclude a transient ischemic attack (TIA). No residual neurological effects were present. Dizziness may be linked to sinus issues or stress. Previous carotid ultrasound was normal, but additional imaging is warranted due to her CAD and recent symptoms. - Order echocardiogram and imaging of neck vessels to rule out TIA - Refer to ENT for evaluation of sinus congestion - Recheck CBC to monitor hemoglobin and  platelet levels - Advise hydration - Discuss stress management strategies  Sinus Congestion CT scan indicated right maxillary opacification, suggesting sinusitis or a polyp. She reports no drainage or sinus symptoms but experiences dizziness, potentially related to sinus congestion affecting equilibrium. ENT referral is advised for further evaluation. - Refer to ENT for evaluation of sinus congestion  Coronary Artery Disease (CAD) She has coronary artery disease and NSTEMI, with regular cardiology follow-ups. Cardiac imaging update is recommended due to recent dizziness and CAD. She should contact her cardiologist to discuss neck imaging or an updated echocardiogram. - Contact cardiologist to discuss potential neck imaging or updated echocardiogram - Advise contacting cardiologist for earlier appointment if possible  Elevated Hemoglobin Hemoglobin level is elevated at 15.8, possibly due to dehydration, smoking, or increased red blood cell production. Iron supplements may contribute to elevated levels. Rechecking will determine if this is isolated or persistent. - Recheck CBC to monitor hemoglobin levels - Advise hydration  Thrombocytopenia Platelet count is 132, below normal range but not concerningly  low. Immediate treatment or hematology referral is unnecessary unless platelet count drops below 100 or symptoms like excessive bleeding or unexplained bruising occur. - Recheck CBC to monitor platelet levels - Advise monitoring for signs of bleeding or bruising  Stress and Anxiety She experiences high stress due to personal circumstances, including her husband's mental health crisis and home repairs. She is open to medication for stress management. Buspirone was discussed for anxiety relief, with no significant interactions expected with current medications. - Prescribe buspirone as needed for anxiety - Discuss stress management strategies  General Health Maintenance Blood pressure is well-controlled at 117/75. Medications and potential interactions, particularly with buspirone, were reviewed. - Review medication interactions with buspirone  Follow-up She is scheduled to see her cardiologist in November and ENT as needed. Follow-up on lab results and specialist recommendations is planned. - Follow up on CBC results - Follow up with ENT and cardiologist as needed     No follow-ups on file.         Rockie Agent, MD  [facility] [phone] (phone) [phone] (fax)  [facility]

## 2023-08-09 ENCOUNTER — Ambulatory Visit: Payer: Self-pay | Admitting: Family Medicine

## 2023-08-09 LAB — CBC WITH DIFFERENTIAL/PLATELET
Basophils Absolute: 0 10*3/uL (ref 0.0–0.2)
Basos: 0 %
EOS (ABSOLUTE): 0.1 10*3/uL (ref 0.0–0.4)
Eos: 1 %
Hematocrit: 45.5 % (ref 34.0–46.6)
Hemoglobin: 15.3 g/dL (ref 11.1–15.9)
Immature Grans (Abs): 0 10*3/uL (ref 0.0–0.1)
Immature Granulocytes: 0 %
Lymphocytes Absolute: 0.8 10*3/uL (ref 0.7–3.1)
Lymphs: 13 %
MCH: 32.6 pg (ref 26.6–33.0)
MCHC: 33.6 g/dL (ref 31.5–35.7)
MCV: 97 fL (ref 79–97)
Monocytes Absolute: 0.6 10*3/uL (ref 0.1–0.9)
Monocytes: 9 %
Neutrophils Absolute: 4.6 10*3/uL (ref 1.4–7.0)
Neutrophils: 77 %
Platelets: 175 10*3/uL (ref 150–450)
RBC: 4.7 x10E6/uL (ref 3.77–5.28)
RDW: 11.7 % (ref 11.7–15.4)
WBC: 6.1 10*3/uL (ref 3.4–10.8)

## 2023-08-17 DIAGNOSIS — J320 Chronic maxillary sinusitis: Secondary | ICD-10-CM | POA: Diagnosis not present

## 2023-08-18 DIAGNOSIS — H25043 Posterior subcapsular polar age-related cataract, bilateral: Secondary | ICD-10-CM | POA: Diagnosis not present

## 2023-08-18 DIAGNOSIS — H18413 Arcus senilis, bilateral: Secondary | ICD-10-CM | POA: Diagnosis not present

## 2023-08-18 DIAGNOSIS — H2511 Age-related nuclear cataract, right eye: Secondary | ICD-10-CM | POA: Diagnosis not present

## 2023-08-18 DIAGNOSIS — H2513 Age-related nuclear cataract, bilateral: Secondary | ICD-10-CM | POA: Diagnosis not present

## 2023-08-18 DIAGNOSIS — H25013 Cortical age-related cataract, bilateral: Secondary | ICD-10-CM | POA: Diagnosis not present

## 2023-09-05 ENCOUNTER — Encounter: Payer: Self-pay | Admitting: Family Medicine

## 2023-09-05 ENCOUNTER — Ambulatory Visit (INDEPENDENT_AMBULATORY_CARE_PROVIDER_SITE_OTHER): Admitting: Family Medicine

## 2023-09-05 VITALS — BP 109/65 | HR 71 | Temp 98.1°F | Ht 64.0 in | Wt 126.3 lb

## 2023-09-05 DIAGNOSIS — E559 Vitamin D deficiency, unspecified: Secondary | ICD-10-CM | POA: Diagnosis not present

## 2023-09-05 DIAGNOSIS — E038 Other specified hypothyroidism: Secondary | ICD-10-CM

## 2023-09-05 DIAGNOSIS — E611 Iron deficiency: Secondary | ICD-10-CM | POA: Diagnosis not present

## 2023-09-05 DIAGNOSIS — E538 Deficiency of other specified B group vitamins: Secondary | ICD-10-CM | POA: Diagnosis not present

## 2023-09-05 DIAGNOSIS — I10 Essential (primary) hypertension: Secondary | ICD-10-CM | POA: Diagnosis not present

## 2023-09-05 DIAGNOSIS — R7303 Prediabetes: Secondary | ICD-10-CM

## 2023-09-05 NOTE — Progress Notes (Signed)
 Established patient visit   Patient: Sheila Herrera   DOB: 10/09/1950   73 y.o. Female  MRN: [id number] Visit Date: 09/05/2023  Today's healthcare provider: Rockie Agent, MD   Chief Complaint  Patient presents with  . Medical Management of Chronic Issues    Patient reports that she thinks she is prediabetic, when I asked her regarding diabetes.    . Hypertension    Patient is here for follow-up of elevated blood pressure. She is not exercising due to having a prolapse.  Patient is adherent to a low-salt diet. Blood pressure is well controlled at home. Patient denies any symptoms.     Subjective     HPI     Medical Management of Chronic Issues    Additional comments: Patient reports that she thinks she is prediabetic, when I asked her regarding diabetes.          Hypertension    Additional comments: Patient is here for follow-up of elevated blood pressure. She is not exercising due to having a prolapse.  Patient is adherent to a low-salt diet. Blood pressure is well controlled at home. Patient denies any symptoms.        Last edited by Terrel Powell CROME, CMA on 09/05/2023 10:37 AM.       Discussed the use of AI scribe software for clinical note transcription with the patient, who gave verbal consent to proceed.  History of Present Illness Sheila Herrera is a 73 year old female with hypertension and coronary artery disease who presents for follow-up of her chronic conditions and recent dizziness.  She has been experiencing dizziness, which has improved since she started taking her vitamins later in the day, avoiding taking them all at once. No recent episodes of dizziness related to fasting or sun exposure.  Her hypertension is managed with amlodipine 5 mg daily, hydrochlorothiazide 25 mg daily, metoprolol 25 mg daily, and Micardis 20 mg twice weekly.  Her diabetes, previously well-controlled, is now in the prediabetes range with a last A1c of  5.8% five months ago. She continues to take metformin  500 mg once daily with breakfast and is concerned about her A1c levels due to recent stress.  She is on Synthroid  50 mcg daily for hypothyroidism and has not reported any new symptoms. She also has Buspar  7.5 mg twice a day prescribed for anxiety but has not started it yet. She is concerned about medication refills, particularly metformin , depending on her A1c results.  She has a history of B12 deficiency but is no longer taking B12 supplements. She continues to take iron and vitamin D  supplements and is interested in checking her levels to determine if she needs to continue them.  Her weight is 126 pounds, down from 129 pounds at the end of June, attributed to recent stress and caregiving responsibilities. She drinks five bottles of water a day, equating to at least 64 ounces.  She is scheduled for cataract surgery in September.     Past Medical History:  Diagnosis Date  . Diabetes mellitus without complication (HCC)   . Heart murmur   . Hypertension     Medications: Outpatient Medications Prior to Visit  Medication Sig  . Alirocumab (PRALUENT) 150 MG/ML SOAJ Inject into the skin.  SABRA amLODipine (NORVASC) 5 MG tablet TAKE 1 TABLET (5 MG TOTAL) BY MOUTH DAILY.  SABRA aspirin 81 MG chewable tablet Chew 81 mg by mouth.  . ergocalciferol  (VITAMIN D2) 1.25 MG (50000 UT) capsule  Take 1 capsule once a week for 8 weeks.  . hydrochlorothiazide (HYDRODIURIL) 25 MG tablet Take 25 mg by mouth daily.  . levothyroxine  (SYNTHROID ) 50 MCG tablet Take 1 tablet (50 mcg total) by mouth daily.  . metFORMIN  (GLUCOPHAGE ) 500 MG tablet Take 1 tablet (500 mg total) by mouth daily with breakfast.  . metoprolol succinate (TOPROL-XL) 25 MG 24 hr tablet TAKE 1 TABLET (25 MG TOTAL) BY MOUTH DAILY.  . nitroGLYCERIN (NITROSTAT) 0.4 MG SL tablet   . nystatin  (MYCOSTATIN /NYSTOP ) powder Apply 1 Application topically 3 (three) times daily.  . ondansetron  (ZOFRAN -ODT) 4  MG disintegrating tablet Take 1 tablet (4 mg total) by mouth every 8 (eight) hours as needed for nausea or vomiting.  SABRA telmisartan (MICARDIS) 20 MG tablet Take 20 mg by mouth 2 (two) times a week.  . busPIRone  (BUSPAR ) 7.5 MG tablet Take 1 tablet (7.5 mg total) by mouth 2 (two) times daily. (Patient not taking: Reported on 09/05/2023)  . cyanocobalamin  (VITAMIN B12) 1000 MCG tablet Take 1,000 mcg by mouth daily. (Patient not taking: Reported on 09/05/2023)   No facility-administered medications prior to visit.    Review of Systems  Last metabolic panel Lab Results  Component Value Date   GLUCOSE 113 (H) 05/01/2023   NA 138 05/01/2023   K 3.4 (L) 05/01/2023   CL 104 05/01/2023   CO2 27 05/01/2023   BUN 27 (H) 05/01/2023   CREATININE 0.90 05/01/2023   GFRNONAA >60 05/01/2023   CALCIUM 9.8 05/01/2023   PHOS 3.4 11/17/2021   PROT 7.7 08/21/2022   ALBUMIN 4.3 08/21/2022   LABGLOB 2.7 02/05/2016   AGRATIO 1.5 02/05/2016   BILITOT 1.0 08/21/2022   ALKPHOS 45 08/21/2022   AST 17 08/21/2022   ALT 14 08/21/2022   ANIONGAP 7 05/01/2023   Last lipids Lab Results  Component Value Date   CHOL 152 03/16/2023   HDL 61 03/16/2023   LDLCALC 68 03/16/2023   TRIG 131 03/16/2023   CHOLHDL 2.5 03/16/2023   Last hemoglobin A1c Lab Results  Component Value Date   HGBA1C 5.8 (A) 03/16/2023   Last vitamin D  No results found for: 25OHVITD2, 25OHVITD3, VD25OH       Objective    BP 109/65 (BP Location: Right Arm, Patient Position: Sitting, Cuff Size: Normal)   Pulse 71   Temp 98.1 F (36.7 C) (Oral)   Ht 5' 4 (1.626 m)   Wt 126 lb 4.8 oz (57.3 kg)   SpO2 99%   BMI 21.68 kg/m  BP Readings from Last 3 Encounters:  09/05/23 109/65  08/08/23 117/75  05/30/23 124/63   Wt Readings from Last 3 Encounters:  09/05/23 126 lb 4.8 oz (57.3 kg)  08/08/23 129 lb (58.5 kg)  05/30/23 128 lb (58.1 kg)        Physical Exam  Physical Exam VITALS: BP- 109/65 MEASUREMENTS: Weight-  126. GENERAL: Well appearing elderly female, thin frame, no acute distress. CHEST: Lungs clear to auscultation bilaterally. CARDIOVASCULAR: Regular rate and rhythm, systolic murmur. EXTREMITIES: No lower extremity edema. PSYCHIATRIC: Mildly anxious, normal affect and mood.    No results found for any visits on 09/05/23.  Assessment & Plan     Problem List Items Addressed This Visit       Cardiovascular and Mediastinum   HTN (hypertension) - Primary   Relevant Orders   BMP8+EGFR     Endocrine   Hypothyroidism     Other   B12 deficiency   Other Visit Diagnoses  Prediabetes       Relevant Orders   Hemoglobin A1c     Vitamin D  deficiency       Relevant Orders   VITAMIN D  25 Hydroxy (Vit-D Deficiency, Fractures)     Iron deficiency       Relevant Orders   Fe+TIBC+Fer   CBC        Assessment & Plan Hypertension Chronic hypertension, well-controlled with current medication regimen. Blood pressure is 109/65 mmHg, within target range. - Continue amlodipine 5 mg daily - Continue hydrochlorothiazide 25 mg daily - Continue metoprolol 25 mg daily - Continue Micardis 20 mg twice weekly  Coronary Artery Disease Coronary artery disease involving native coronary artery. No new symptoms reported.  Prediabetes Chronic  Previously diagnosed with diabetes, now in remission and in the prediabetes range. Last A1c was 5.8% five months ago. Concern about current A1c level due to stress. - Order A1c test - Continue metformin  500 mg daily with breakfast  Hypothyroidism Chronic hypothyroidism, managed with Synthroid . No new symptoms reported. - Continue Synthroid  50 mcg daily    Dizziness Improvement in dizziness symptoms after ENT referral for dizziness and sinus condition.  Anxiety Mild anxiety. Buspar  prescribed but not yet taken. Discussed that Buspar  can work quickly for anxiety symptoms if needed. - Consider starting Buspar  7.5 mg twice a day if needed for  anxiety  General Health Maintenance Maintaining hydration with at least 64 ounces of water daily. Weight is 126 pounds, down from previous visit. Caregiver responsibilities contributing to stress. - Order basic metabolic panel - Order CBC - Order vitamin D  level - Order iron level     Return in about 6 months (around 03/07/2024) for Chronic F/U.         Rockie Agent, MD  [facility] [phone] (phone) [phone] (fax)  [facility]

## 2023-09-06 LAB — IRON,TIBC AND FERRITIN PANEL
Ferritin: 174 ng/mL — ABNORMAL HIGH (ref 15–150)
Iron Saturation: 36 % (ref 15–55)
Iron: 104 ug/dL (ref 27–139)
Total Iron Binding Capacity: 286 ug/dL (ref 250–450)
UIBC: 182 ug/dL (ref 118–369)

## 2023-09-06 LAB — BMP8+EGFR
BUN/Creatinine Ratio: 18 (ref 12–28)
BUN: 17 mg/dL (ref 8–27)
CO2: 22 mmol/L (ref 20–29)
Calcium: 10.6 mg/dL — ABNORMAL HIGH (ref 8.7–10.3)
Chloride: 101 mmol/L (ref 96–106)
Creatinine, Ser: 0.93 mg/dL (ref 0.57–1.00)
Glucose: 97 mg/dL (ref 70–99)
Potassium: 4.4 mmol/L (ref 3.5–5.2)
Sodium: 141 mmol/L (ref 134–144)
eGFR: 66 mL/min/1.73 (ref >59)

## 2023-09-06 LAB — VITAMIN D 25 HYDROXY (VIT D DEFICIENCY, FRACTURES): Vit D, 25-Hydroxy: 53.9 ng/mL (ref 30.0–100.0)

## 2023-09-06 LAB — CBC
Hematocrit: 42.5 % (ref 34.0–46.6)
Hemoglobin: 14 g/dL (ref 11.1–15.9)
MCH: 31.9 pg (ref 26.6–33.0)
MCHC: 32.9 g/dL (ref 31.5–35.7)
MCV: 97 fL (ref 79–97)
Platelets: 182 x10E3/uL (ref 150–450)
RBC: 4.39 x10E6/uL (ref 3.77–5.28)
RDW: 12.2 % (ref 11.7–15.4)
WBC: 4.6 x10E3/uL (ref 3.4–10.8)

## 2023-09-06 LAB — HEMOGLOBIN A1C
Est. average glucose Bld gHb Est-mCnc: 128 mg/dL
Hgb A1c MFr Bld: 6.1 % — ABNORMAL HIGH (ref 4.8–5.6)

## 2023-09-09 ENCOUNTER — Ambulatory Visit: Payer: Self-pay | Admitting: Family Medicine

## 2023-09-09 NOTE — Progress Notes (Signed)
 Called patient and relayed message per provider, she verbalized understanding stated that she would stop the Iron supplement until she comes back and gets lab work.

## 2023-09-13 ENCOUNTER — Ambulatory Visit: Payer: Medicare HMO | Admitting: Family Medicine

## 2023-09-15 MED ORDER — HYDROCHLOROTHIAZIDE 25 MG TABLET
ORAL_TABLET | Freq: Every day | ORAL | 1 refills | 90.00000 days | Status: CP
Start: 2023-09-15 — End: ?

## 2023-09-24 IMAGING — MR MR HEAD W/O CM
12 series · 48 of 48 positions shown · non-contrast
Comparison: Same-day noncontrast CT head

CLINICAL DATA: Dizziness for 3 weeks

EXAM:
MRI HEAD WITHOUT CONTRAST
TECHNIQUE: Multiplanar, multiecho pulse sequences of the brain and surrounding
structures were obtained without intravenous contrast.

[Series 5: ax dwi_tracew · axial · 3.0mm · 0.65mm/px · z∈[-82,+73]mm · 4 of 48 slices shown]
[im 1/48]
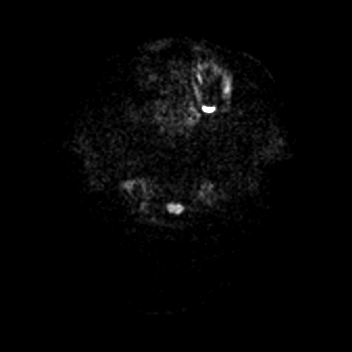
[im 16/48]
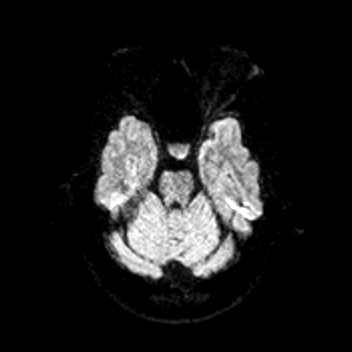
[im 32/48]
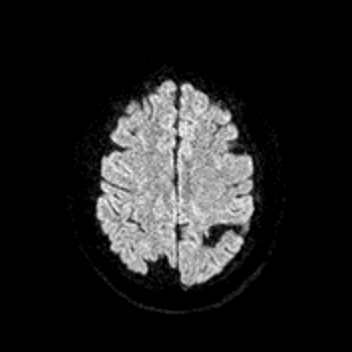
[im 48/48]
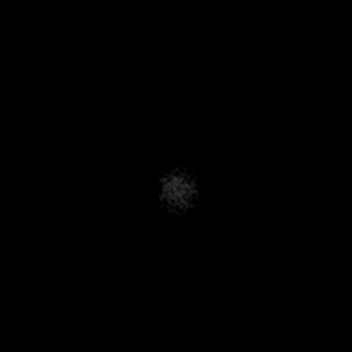

[Series 6: ax dwi_adc · axial · 3.0mm · 0.65mm/px · z∈[-82,+73]mm · 3 of 48 slices shown]
[im 1/48]
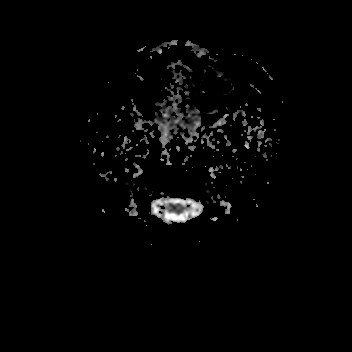
[im 24/48]
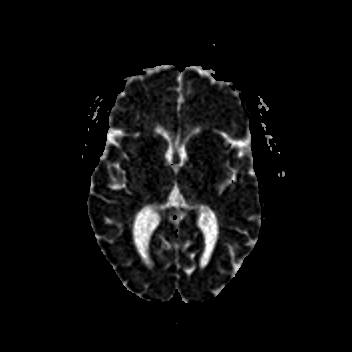
[im 48/48]
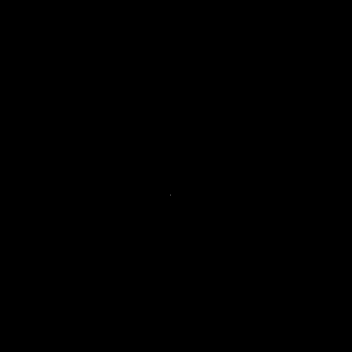

[Series 7: cor dwi_tracew · coronal · 5.0mm · 1.80mm/px · 3 of 38 slices shown]
[im 1/38]
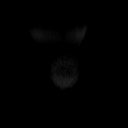
[im 19/38]
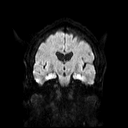
[im 38/38]
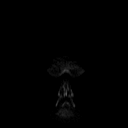

[Series 8: cor dwi_adc · coronal · 5.0mm · 1.80mm/px · 3 of 38 slices shown]
[im 1/38]
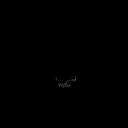
[im 19/38]
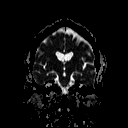
[im 38/38]
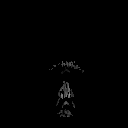

[Series 9: T1 · sagittal · 5.0mm · 0.62mm/px · 2 of 25 slices shown (1 of 2)]
[im 1/25]
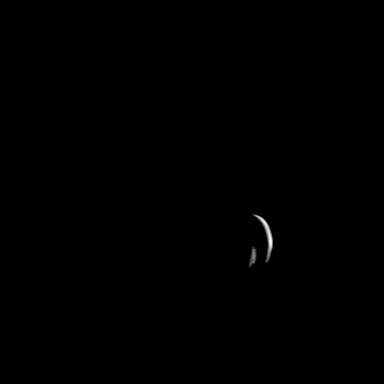
[im 25/25]
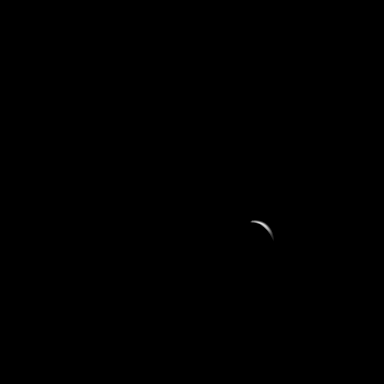

[Series 10: T2 · axial · 5.0mm · 0.53mm/px · z∈[-81,+63]mm · 2 of 25 slices shown (1 of 2)]
[im 1/25]
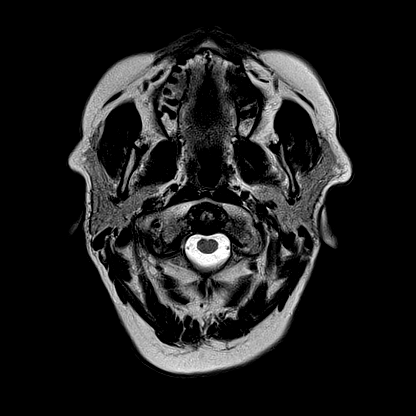
[im 25/25]
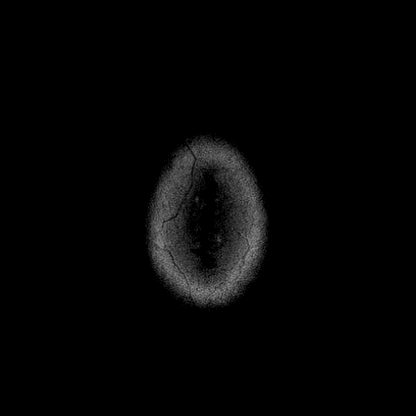

[Series 11: mag_images · axial · 3.0mm · 0.90mm/px · z∈[-97,+80]mm · 4 of 60 slices shown]
[im 1/60]
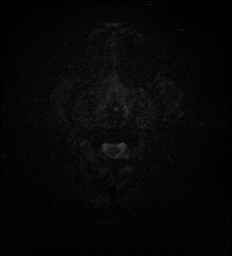
[im 20/60]
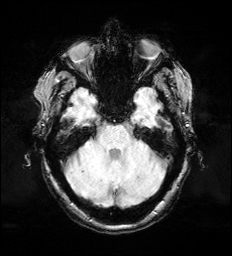
[im 40/60]
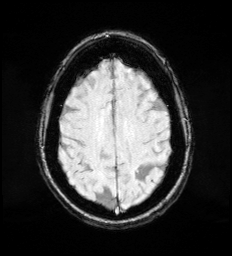
[im 60/60]
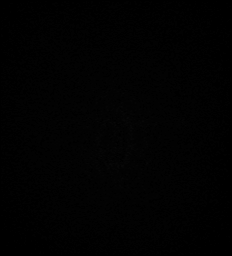

[Series 12: pha_images · axial · 3.0mm · 0.90mm/px · z∈[-97,+80]mm · 4 of 60 slices shown]
[im 1/60]
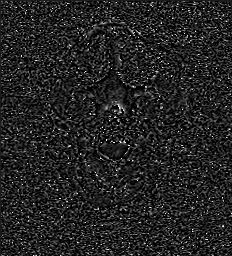
[im 20/60]
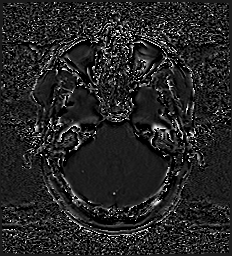
[im 40/60]
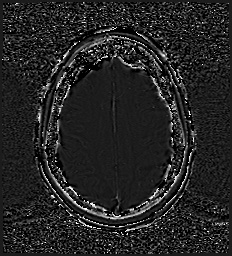
[im 60/60]
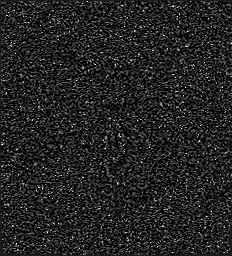

[Series 13: swi_images · axial · 3.0mm · 0.90mm/px · z∈[-97,+80]mm · 4 of 60 slices shown]
[im 1/60]
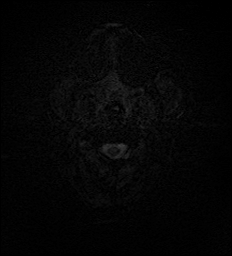
[im 20/60]
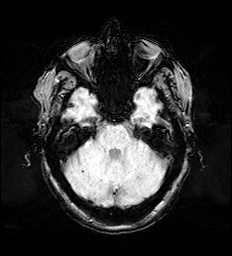
[im 40/60]
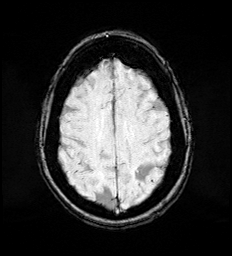
[im 60/60]
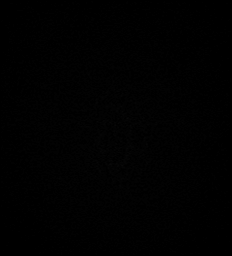

[Series 16: T2 · coronal · 5.0mm · 0.57mm/px · 2 of 29 slices shown (2 of 2)]
[im 1/29]
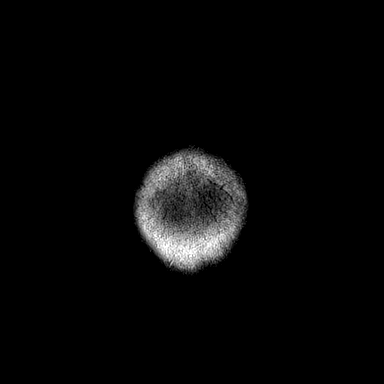
[im 29/29]
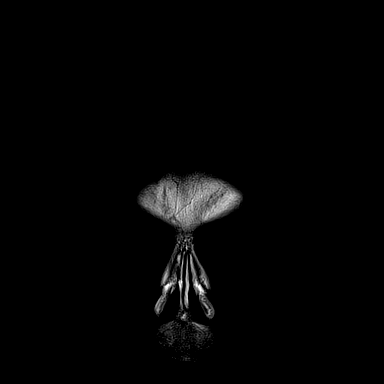

[Series 17: FLAIR · axial · 3.0mm · 0.53mm/px · z∈[-90,+71]mm · 4 of 55 slices shown]
[im 1/55]
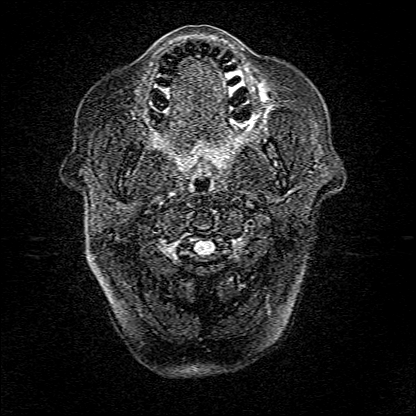
[im 19/55]
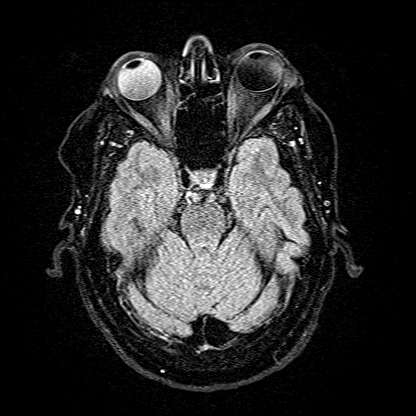
[im 37/55]
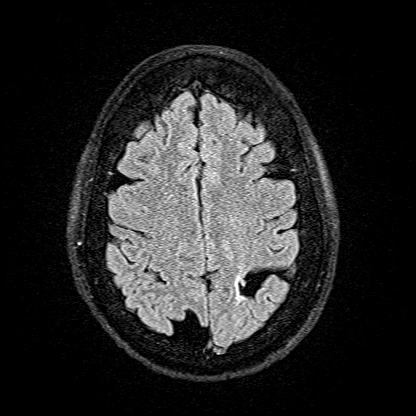
[im 55/55]
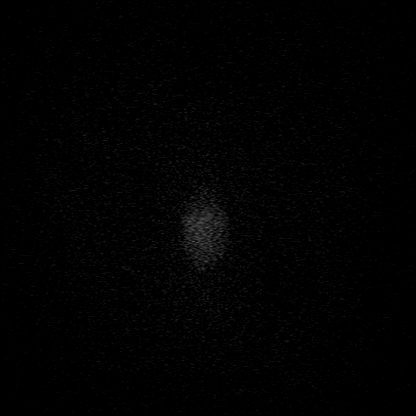

[Series 18: T1 · axial · 1.0mm · 0.98mm/px · z∈[-95,+79]mm · 13 of 176 slices shown (2 of 2)]
[im 1/176]
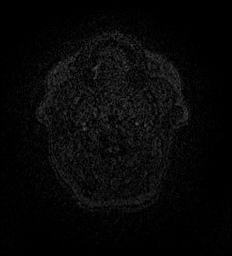
[im 15/176]
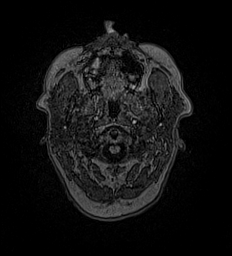
[im 30/176]
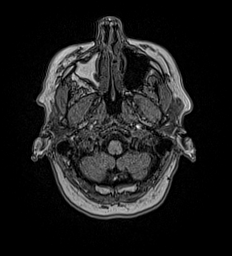
[im 44/176]
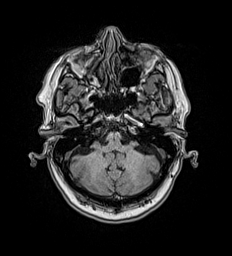
[im 59/176]
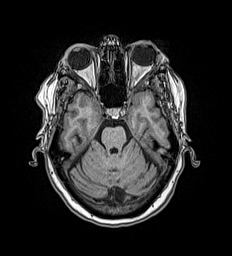
[im 73/176]
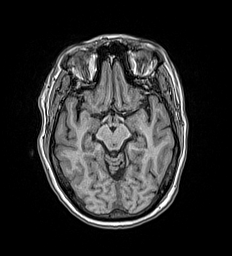
[im 88/176]
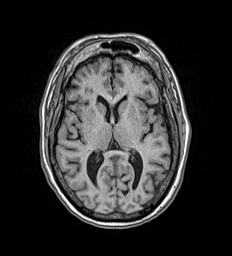
[im 103/176]
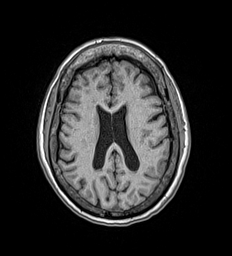
[im 117/176]
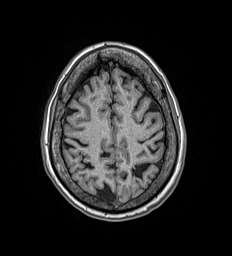
[im 132/176]
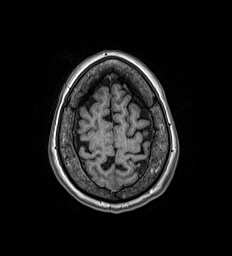
[im 146/176]
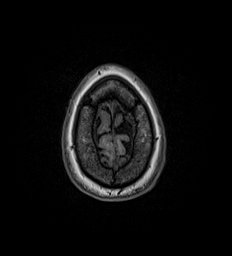
[im 161/176]
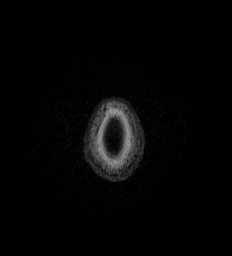
[im 176/176]
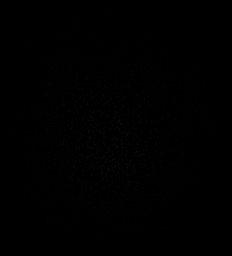

[48 of 48 positions shown; findings below may reference images not displayed]

FINDINGS: Brain: There is no acute intracranial hemorrhage, extra-axial fluid
collection, or acute infarct

Parenchymal volume is normal for age. The ventricles are normal in
size. There is a remote cortical infarct in the left parietal lobe.
Gray-white differentiation is otherwise preserved. Scattered
additional small foci of FLAIR signal abnormality in the subcortical
and periventricular white matter are nonspecific but may reflect
sequela of minimal chronic white matter microangiopathy.

There is no suspicious parenchymal signal abnormality. There is no
mass lesion. There is no mass effect or midline shift.

Vascular: Normal flow voids.

Skull and upper cervical spine: Normal marrow signal.

Sinuses/Orbits: There is complete opacification of the right
sphenoid sinus with T2 hypointense material which may reflect
inspissated secretions or fungal colonization. The other paranasal
sinuses are clear. The globes and orbits are unremarkable.

Other: None.
IMPRESSION: 1. No acute intracranial pathology.
2. Small remote cortical infarct in the left parietal lobe.

## 2023-09-29 ENCOUNTER — Other Ambulatory Visit: Payer: Self-pay | Admitting: Family Medicine

## 2023-10-01 ENCOUNTER — Other Ambulatory Visit: Payer: Self-pay | Admitting: Family Medicine

## 2023-10-01 DIAGNOSIS — F419 Anxiety disorder, unspecified: Secondary | ICD-10-CM

## 2023-10-31 ENCOUNTER — Other Ambulatory Visit: Payer: Self-pay | Admitting: Family Medicine

## 2023-11-13 ENCOUNTER — Other Ambulatory Visit: Payer: Self-pay | Admitting: Family Medicine

## 2023-11-13 DIAGNOSIS — E038 Other specified hypothyroidism: Secondary | ICD-10-CM

## 2023-11-27 ENCOUNTER — Other Ambulatory Visit: Payer: Self-pay | Admitting: Family Medicine

## 2023-11-27 DIAGNOSIS — F419 Anxiety disorder, unspecified: Secondary | ICD-10-CM

## 2023-12-21 ENCOUNTER — Ambulatory Visit: Admit: 2023-12-21 | Discharge: 2023-12-22 | Payer: Medicare (Managed Care)

## 2023-12-21 DIAGNOSIS — I2510 Atherosclerotic heart disease of native coronary artery without angina pectoris: Principal | ICD-10-CM

## 2023-12-21 DIAGNOSIS — I10 Essential (primary) hypertension: Principal | ICD-10-CM

## 2023-12-21 MED ORDER — AMLODIPINE 10 MG TABLET
ORAL_TABLET | Freq: Every day | ORAL | 3 refills | 100.00000 days | Status: CP
Start: 2023-12-21 — End: ?

## 2024-01-12 ENCOUNTER — Encounter: Payer: Self-pay | Admitting: Family Medicine

## 2024-01-12 ENCOUNTER — Ambulatory Visit: Admitting: Family Medicine

## 2024-01-12 VITALS — BP 127/74 | HR 72 | Ht 64.0 in | Wt 127.9 lb

## 2024-01-12 DIAGNOSIS — I10 Essential (primary) hypertension: Secondary | ICD-10-CM | POA: Diagnosis not present

## 2024-01-12 DIAGNOSIS — T466X5A Adverse effect of antihyperlipidemic and antiarteriosclerotic drugs, initial encounter: Secondary | ICD-10-CM

## 2024-01-12 DIAGNOSIS — G720 Drug-induced myopathy: Secondary | ICD-10-CM | POA: Diagnosis not present

## 2024-01-12 DIAGNOSIS — M8589 Other specified disorders of bone density and structure, multiple sites: Secondary | ICD-10-CM | POA: Diagnosis not present

## 2024-01-12 DIAGNOSIS — M109 Gout, unspecified: Secondary | ICD-10-CM | POA: Diagnosis not present

## 2024-01-12 DIAGNOSIS — R600 Localized edema: Secondary | ICD-10-CM

## 2024-01-12 DIAGNOSIS — I2510 Atherosclerotic heart disease of native coronary artery without angina pectoris: Secondary | ICD-10-CM | POA: Diagnosis not present

## 2024-01-12 MED ORDER — COLCHICINE 0.6 MG PO TABS: ORAL_TABLET | ORAL | 0 refills | Status: AC

## 2024-01-12 MED ORDER — AMLODIPINE BESYLATE 5 MG PO TABS: 5.0000 mg | ORAL_TABLET | Freq: Every day | ORAL | 1 refills | Status: AC

## 2024-01-12 NOTE — Progress Notes (Signed)
 ACUTE VISIT   Patient: Sheila Herrera   DOB: Apr 16, 1950   73 y.o. Female  MRN: [id number]   PCP: Sharma Coyer, MD  Chief Complaint  Patient presents with   Gout    Patient c/o gout flare located on right big toe onset x 6 days. Noticed joints in thumb were bothering here then the toe started    Leg Swelling    Patient states cardiologist doubled amlodipine  does to 10mg  and she believes it may be causing legs to swell, patient as not discussed this with cardiologist    Subjective    HPI HPI     Gout    Additional comments: Patient c/o gout flare located on right big toe onset x 6 days. Noticed joints in thumb were bothering here then the toe started         Leg Swelling    Additional comments: Patient states cardiologist doubled amlodipine  does to 10mg  and she believes it may be causing legs to swell, patient as not discussed this with cardiologist       Last edited by Cherry Chiquita HERO, CMA on 01/12/2024  1:51 PM.       Discussed the use of AI scribe software for clinical note transcription with the patient, who gave verbal consent to proceed.  History of Present Illness Sheila Herrera is a 73 year old female with gout who presents with a possible gout flare in the right toe.  She has been experiencing discomfort in her right toe for six days, which began with discomfort in the joints and thumb. She has a history of gout, with a previous episode occurring after starting Ozempic , which was associated with a significant increase in uric acid levels. Her uric acid levels have not been checked since 2023.  She experiences bilateral leg swelling, which began after her cardiologist increased her amlodipine  dosage to 10 mg. Her blood pressure is well controlled at 127/74 mmHg. The swelling in her legs and ankles worsens with prolonged standing, and she finds relief by elevating her feet, although she finds it challenging to do so regularly due to daily  activities.  Her current medications include amlodipine  10 mg, metoprolol 25 mg daily, and telmisartan 20 mg twice a week. She has a history of hypertension, ischemic hypothyroidism, B12 deficiency, and osteopenia. She takes calcium and vitamin D  supplements for bone health. She has experienced statin myopathy in the past and is not currently on any statins.  She mentions dietary triggers for her gout, including red meat, alcohol, shrimp, crab, ham, and turkey, which she tries to avoid. She is concerned about her A1c levels, which were 6.1% at the last check, and anticipates it may be higher due to recent dietary choices.     Medications: Outpatient Medications Prior to Visit  Medication Sig   Alirocumab (PRALUENT) 150 MG/ML SOAJ Inject into the skin.   aspirin 81 MG chewable tablet Chew 81 mg by mouth.   busPIRone  (BUSPAR ) 7.5 MG tablet TAKE 1 TABLET BY MOUTH 2 TIMES DAILY.   cyanocobalamin  (VITAMIN B12) 1000 MCG tablet Take 1,000 mcg by mouth daily.   ergocalciferol  (VITAMIN D2) 1.25 MG (50000 UT) capsule Take 1 capsule once a week for 8 weeks.   levothyroxine  (SYNTHROID ) 50 MCG tablet TAKE 1 TABLET BY MOUTH EVERY DAY   metFORMIN  (GLUCOPHAGE ) 500 MG tablet TAKE 1 TABLET BY MOUTH 2 TIMES DAILY WITH A MEAL.   metoprolol succinate (TOPROL-XL) 25 MG 24 hr tablet  TAKE 1 TABLET (25 MG TOTAL) BY MOUTH DAILY.   nitroGLYCERIN (NITROSTAT) 0.4 MG SL tablet    nystatin  (MYCOSTATIN /NYSTOP ) powder Apply 1 Application topically 3 (three) times daily.   ondansetron  (ZOFRAN -ODT) 4 MG disintegrating tablet Take 1 tablet (4 mg total) by mouth every 8 (eight) hours as needed for nausea or vomiting.   telmisartan (MICARDIS) 20 MG tablet Take 20 mg by mouth 2 (two) times a week.   [DISCONTINUED] amLODipine  (NORVASC ) 5 MG tablet TAKE 1 TABLET (5 MG TOTAL) BY MOUTH DAILY. (Patient taking differently: Take 10 mg by mouth daily.)   [DISCONTINUED] hydrochlorothiazide (HYDRODIURIL) 25 MG tablet Take 25 mg by mouth  daily.   No facility-administered medications prior to visit.    Last hemoglobin A1c Lab Results  Component Value Date   HGBA1C 6.1 (H) 09/05/2023        Objective    BP 127/74 (BP Location: Left Arm, Patient Position: Sitting, Cuff Size: Normal)   Pulse 72   Ht 5' 4 (1.626 m)   Wt 127 lb 14.4 oz (58 kg)   SpO2 100%   BMI 21.95 kg/m  BP Readings from Last 3 Encounters:  01/12/24 127/74  09/05/23 109/65  08/08/23 117/75   Wt Readings from Last 3 Encounters:  01/12/24 127 lb 14.4 oz (58 kg)  09/05/23 126 lb 4.8 oz (57.3 kg)  08/08/23 129 lb (58.5 kg)      Physical Exam   Physical Exam VITALS: BP- 120/74 EXTREMITIES: Right foot swollen and tender at base of right great toe, 2+ dorsalis pedis and posterior tibialis pulses    No results found for any visits on 01/12/24.  Assessment & Plan     Assessment & Plan    Problem List Items Addressed This Visit     Acute gout involving toe of right foot - Primary   Acute gout flare, right toe Acute gout flare in the right toe, likely triggered by dietary indiscretion. Previous flare occurred after Ozempic  administration. Uric acid levels have not been checked since 2023. Current symptoms include swelling and pain in the right toe. - Prescribed colchicine  1.2 mg twice daily for one day, followed by 0.6 mg once daily for up to five days. - Instructed to call if no improvement by Monday for potential steroid pack prescription.      Relevant Medications   colchicine  0.6 MG tablet   Coronary artery disease involving native coronary artery   CAD  Chronic  Continue praluent 150mg  every 2 weeks  Follow up with cardiology  Continue ASA 81mg  daily       Relevant Medications   amLODipine  (NORVASC ) 5 MG tablet   HTN (hypertension)   Primary hypertension Chronic  Blood pressure is well controlled at 127/74 mmHg. Recent increase in amlodipine  to 10 mg by cardiologist may be contributing to leg swelling. Previous use of  hydrochlorothiazide was associated with no gout or swelling issues. Current regimen includes metoprolol 25 mg daily and telmisartan twice weekly. - Reduced amlodipine  to 5 mg to assess impact on leg swelling. - Monitor blood pressure and adjust amlodipine  dosage as needed. - Will reassess blood pressure regimen in January.      Relevant Medications   amLODipine  (NORVASC ) 5 MG tablet   Osteopenia   Chronic  Managed with calcium and vitamin D  supplementation. Concerns about high serum calcium levels potentially related to supplementation. Balancing bone health with potential hypercalcemia is a priority. - Continue calcium and vitamin D  supplementation. - Will recheck serum calcium  and PTH levels in January.      Statin myopathy   Statin myopathy Chronic  Previous statin therapy resulted in myopathy. Currently not on statins. Previous trial of Repatha was not tolerated due to severe back pain. - Continue to avoid statins due to history of myopathy. - continue praluent 150mg        Other Visit Diagnoses       Lower extremity edema            Return if symptoms worsen or fail to improve.        Rockie Agent, MD  [facility] [phone] (phone) [phone] (fax)  [facility]

## 2024-01-13 ENCOUNTER — Telehealth: Payer: Self-pay | Admitting: Family Medicine

## 2024-01-13 DIAGNOSIS — M109 Gout, unspecified: Secondary | ICD-10-CM

## 2024-01-13 MED ORDER — TRAMADOL HCL 50 MG PO TABS: 50.0000 mg | ORAL_TABLET | Freq: Three times a day (TID) | ORAL | 0 refills | Status: AC | PRN

## 2024-01-13 MED ORDER — PREDNISONE 20 MG PO TABS: ORAL_TABLET | ORAL | 0 refills | Status: AC

## 2024-01-13 NOTE — Assessment & Plan Note (Signed)
 Primary hypertension Chronic  Blood pressure is well controlled at 127/74 mmHg. Recent increase in amlodipine  to 10 mg by cardiologist may be contributing to leg swelling. Previous use of hydrochlorothiazide was associated with no gout or swelling issues. Current regimen includes metoprolol 25 mg daily and telmisartan twice weekly. - Reduced amlodipine  to 5 mg to assess impact on leg swelling. - Monitor blood pressure and adjust amlodipine  dosage as needed. - Will reassess blood pressure regimen in January.

## 2024-01-13 NOTE — Telephone Encounter (Signed)
 Spoke with patient and relayed message from provider. Patient asked if she is able to take tylenol instead of tramadol . Verified with Dr. Lang verbally and she advised that patient can take tylenol instead of tramadol . Providers message below   they'd be fine together if she continued to have pain after the tylenol. she can decline to fill the tramadol  if the pain is not that severe

## 2024-01-13 NOTE — Telephone Encounter (Signed)
 Copied from CRM [id number]. Topic: Clinical - Medical Advice >> Jan 13, 2024 11:30 AM Tiffini S wrote: Reason for CRM: Patient is still having a flare up with pain and discomfort- patient was prescribed medication yesterday- was told to call back if issue continued/ refused triage nurse. Verified pharmacy as:   [id number] [place], [place] - [address] [place] [address] Phone: [phone] Fax: [phone]    Please call the patient back at [phone]

## 2024-01-13 NOTE — Assessment & Plan Note (Signed)
 CAD  Chronic  Continue praluent 150mg  every 2 weeks  Follow up with cardiology  Continue ASA 81mg  daily

## 2024-01-13 NOTE — Telephone Encounter (Signed)
 Two prescriptions sent to patient's pharmacy for gout flare

## 2024-01-13 NOTE — Assessment & Plan Note (Signed)
 Acute gout flare, right toe Acute gout flare in the right toe, likely triggered by dietary indiscretion. Previous flare occurred after Ozempic  administration. Uric acid levels have not been checked since 2023. Current symptoms include swelling and pain in the right toe. - Prescribed colchicine  1.2 mg twice daily for one day, followed by 0.6 mg once daily for up to five days. - Instructed to call if no improvement by Monday for potential steroid pack prescription.

## 2024-01-13 NOTE — Patient Instructions (Signed)
 To keep you healthy, please keep in mind the following health maintenance items that you are due for:   Health Maintenance Due  Topic Date Due   Hepatitis C Screening  Never done   Pneumococcal Vaccine: 50+ Years (1 of 2 - PCV) Never done   Zoster Vaccines- Shingrix (1 of 2) 04/06/1971   Colonoscopy  Never done   Influenza Vaccine  09/09/2023   Diabetic kidney evaluation - Urine ACR  09/15/2023   Mammogram  12/13/2023     Best Wishes,   Dr. Lang

## 2024-01-13 NOTE — Assessment & Plan Note (Signed)
 Statin myopathy Chronic  Previous statin therapy resulted in myopathy. Currently not on statins. Previous trial of Repatha was not tolerated due to severe back pain. - Continue to avoid statins due to history of myopathy. - continue praluent 150mg 

## 2024-01-13 NOTE — Assessment & Plan Note (Signed)
 Chronic  Managed with calcium and vitamin D  supplementation. Concerns about high serum calcium levels potentially related to supplementation. Balancing bone health with potential hypercalcemia is a priority. - Continue calcium and vitamin D  supplementation. - Will recheck serum calcium and PTH levels in January.

## 2024-01-20 NOTE — Progress Notes (Signed)
 Sheila Herrera                                          MRN: [id number]   01/20/2024   The [facility] Quality Team Specialist reviewed this patient medical record for the purposes of chart review for care gap closure. The following were reviewed: chart review for care gap closure-kidney health evaluation for diabetes:eGFR  and uACR.    [facility]BCI Quality Team

## 2024-01-26 ENCOUNTER — Other Ambulatory Visit: Payer: Self-pay | Admitting: Family Medicine

## 2024-01-30 ENCOUNTER — Telehealth: Payer: Self-pay

## 2024-01-30 ENCOUNTER — Other Ambulatory Visit: Payer: Self-pay | Admitting: Family Medicine

## 2024-01-30 DIAGNOSIS — E038 Other specified hypothyroidism: Secondary | ICD-10-CM

## 2024-01-30 NOTE — Telephone Encounter (Signed)
 Copied from CRM [id number]. Topic: Clinical - Prescription Issue >> Jan 30, 2024 12:05 PM Olam RAMAN wrote: Reason for CRM: pt calling about  levothyroxine  (SYNTHROID ) 50 MCG tablet wanted to know why it was denied advised was refilled too son

## 2024-02-16 ENCOUNTER — Other Ambulatory Visit: Payer: Self-pay | Admitting: Family Medicine

## 2024-02-16 DIAGNOSIS — E038 Other specified hypothyroidism: Secondary | ICD-10-CM

## 2024-02-16 MED ORDER — LEVOTHYROXINE SODIUM 50 MCG PO TABS: 50.0000 ug | ORAL_TABLET | Freq: Every day | ORAL | 0 refills | Status: AC

## 2024-02-16 NOTE — Telephone Encounter (Signed)
 Copied from CRM #[id number]. Topic: Clinical - Medication Refill >> Feb 16, 2024  8:42 AM Edsel HERO wrote: Medication: levothyroxine  (SYNTHROID ) 50 MCG tablet  Has the patient contacted their pharmacy? Yes  This is the patient's preferred pharmacy:  [id number] - [place], [place] - [address] [place] [address] Phone: [phone] Fax: [phone]  Is this the correct pharmacy for this prescription? Yes If no, delete pharmacy and type the correct one.   Has the prescription been filled recently? Yes  Is the patient out of the medication? Yes  Has the patient been seen for an appointment in the last year OR does the patient have an upcoming appointment? Yes  Can we respond through MyChart? Yes  Agent: Please be advised that Rx refills may take up to 3 business days. We ask that you follow-up with your pharmacy.

## 2024-02-16 NOTE — Telephone Encounter (Signed)
 Requested Prescriptions  Pending Prescriptions Disp Refills   levothyroxine  (SYNTHROID ) 50 MCG tablet 90 tablet 0    Sig: Take 1 tablet (50 mcg total) by mouth daily.     Endocrinology:  Hypothyroid Agents Passed - 02/16/2024  4:34 PM      Passed - TSH in normal range and within 360 days    TSH  Date Value Ref Range Status  03/16/2023 1.600 0.450 - 4.500 uIU/mL Final         Passed - Valid encounter within last 12 months    Recent Outpatient Visits           1 month ago Acute gout involving toe of right foot, unspecified cause   [place] [facility] Simmons-Robinson, More[place], MD   5 months ago Primary hypertension   [place] [facility] Simmons-Robinson, [place], MD   6 months ago Dysequilibrium   [place] [facility] Simmons-Robinson, [place], MD   8 months ago Dizziness   [place] [facility] [place], [place], MD   9 months ago Dizziness   [place] [facility] Arab[place], Rockie, MD

## 2024-02-28 DIAGNOSIS — I2510 Atherosclerotic heart disease of native coronary artery without angina pectoris: Principal | ICD-10-CM

## 2024-02-28 MED ORDER — PRALUENT PEN 150 MG/ML SUBCUTANEOUS PEN INJECTOR
SUBCUTANEOUS | 3 refills | 84.00000 days | Status: CP
Start: 2024-02-28 — End: ?

## 2024-02-28 NOTE — Progress Notes (Signed)
 Sheila Herrera                                          MRN: [id number]   02/28/2024   The [facility]BCI Quality Team Specialist reviewed this patient medical record for the purposes of chart review for care gap closure. The following were reviewed: chart review for care gap closure-kidney health evaluation for diabetes:eGFR  and uACR.    [facility]BCI Quality Team

## 2024-03-05 ENCOUNTER — Ambulatory Visit: Admitting: Family Medicine

## 2024-03-07 ENCOUNTER — Ambulatory Visit: Admitting: Nurse Practitioner

## 2024-03-07 ENCOUNTER — Encounter: Payer: Self-pay | Admitting: Nurse Practitioner

## 2024-03-07 VITALS — BP 120/70 | HR 71 | Temp 98.0°F | Ht 64.0 in | Wt 127.0 lb

## 2024-03-07 DIAGNOSIS — J029 Acute pharyngitis, unspecified: Secondary | ICD-10-CM

## 2024-03-07 DIAGNOSIS — U071 COVID-19: Secondary | ICD-10-CM

## 2024-03-07 DIAGNOSIS — R051 Acute cough: Secondary | ICD-10-CM

## 2024-03-07 LAB — POCT RAPID STREP A (OFFICE): Rapid Strep A Screen: NEGATIVE

## 2024-03-07 LAB — POC COVID19/FLU A&B COMBO
Covid Antigen, POC: POSITIVE — AB
Influenza A Antigen, POC: NEGATIVE
Influenza B Antigen, POC: NEGATIVE

## 2024-03-07 MED ORDER — NIRMATRELVIR/RITONAVIR (PAXLOVID)TABLET: 3.0000 | ORAL_TABLET | Freq: Two times a day (BID) | ORAL | 0 refills | Status: AC

## 2024-03-07 MED ORDER — BENZONATATE 100 MG PO CAPS: 200.0000 mg | ORAL_CAPSULE | Freq: Two times a day (BID) | ORAL | 0 refills | Status: AC | PRN

## 2024-03-07 NOTE — Progress Notes (Signed)
 "  BP 120/70   Pulse 71   Temp 98 F (36.7 C)   Ht 5' 4 (1.626 m)   Wt 127 lb (57.6 kg)   SpO2 99%   BMI 21.80 kg/m    Subjective:    Patient ID: Sheila Herrera, female    DOB: Jul 09, 1950, 74 y.o.   MRN: [id number]  HPI: Sheila Herrera is a 74 y.o. female  Chief Complaint  Patient presents with   Sore Throat    Pt c/o sore throat, congestion, cough x4 days.    Discussed the use of AI scribe software for clinical note transcription with the patient, who gave verbal consent to proceed.  History of Present Illness Sheila Herrera is a 74 year old female who presents with a positive COVID-19 test and symptoms of cough.  Respiratory symptoms - Persistent productive cough since "Sunday -sore throat, nasal congestion, ear pain -denies any fever or shortness of breath - No prior history of COVID-19 infection - Using Chloraseptic for symptomatic relief  Covid-19 infection - Positive COVID-19 test - Negative tests for influenza and strep - Concerned about husband's exposure due to lack of mask use at home - Wearing gloves when administering husband's medications and plans to wear a mask around her  Medication and supplement use - Currently receiving prednisone injections - Taking vitamins C, D, and zinc for immune support  Comorbid conditions and medication compatibility - Inquires about compatibility of COVID-19 treatment with existing heart condition and cholesterol medication         12" /05/2023    1:52 PM 09/05/2023   10:38 AM 08/08/2023    9:34 AM  Depression screen PHQ 2/9  Decreased Interest 0 0 0  Down, Depressed, Hopeless 0 0 0  PHQ - 2 Score 0 0 0  Altered sleeping 0 0 0  Tired, decreased energy 0 0 0  Change in appetite 0 0 0  Feeling bad or failure about yourself  0 0 0  Trouble concentrating 0 0 0  Moving slowly or fidgety/restless 0 0 0  Suicidal thoughts 0 0 0  PHQ-9 Score 0 0  0   Difficult doing work/chores  Not difficult at all Not  difficult at all     Data saved with a previous flowsheet row definition    Relevant past medical, surgical, family and social history reviewed and updated as indicated. Interim medical history since our last visit reviewed. Allergies and medications reviewed and updated.  Review of Systems  Per HPI unless specifically indicated above     Objective:      BP 120/70   Pulse 71   Temp 98 F (36.7 C)   Ht 5' 4 (1.626 m)   Wt 127 lb (57.6 kg)   SpO2 99%   BMI 21.80 kg/m    Wt Readings from Last 3 Encounters:  03/07/24 127 lb (57.6 kg)  01/12/24 127 lb 14.4 oz (58 kg)  09/05/23 126 lb 4.8 oz (57.3 kg)    Physical Exam GENERAL: Alert, cooperative, well developed, no acute distress, afebrile, no dyspnea. HEENT: Normocephalic, normal oropharynx, moist mucous membranes, ears clear. CHEST: Clear to auscultation bilaterally, no wheezes, rhonchi, or crackles. CARDIOVASCULAR: Normal heart rate and rhythm, S1 and S2 normal without murmurs. ABDOMEN: Soft, non-tender, non-distended, without organomegaly, normal bowel sounds. EXTREMITIES: No cyanosis or edema. NEUROLOGICAL: Cranial nerves grossly intact, moves all extremities without gross motor or sensory deficit.  Results for orders placed or performed in visit on 03/07/24  POC Covid19/Flu A&B Antigen   Collection Time: 03/07/24 10:31 AM  Result Value Ref Range   Influenza A Antigen, POC Negative Negative   Influenza B Antigen, POC Negative Negative   Covid Antigen, POC Positive (A) Negative  POCT rapid strep A   Collection Time: 03/07/24 10:31 AM  Result Value Ref Range   Rapid Strep A Screen Negative Negative          Assessment & Plan:   Problem List Items Addressed This Visit   None Visit Diagnoses       COVID-19    -  Primary   Relevant Medications   nirmatrelvir /ritonavir  (PAXLOVID ) 20 x 150 MG & 10 x 100MG  TABS   benzonatate  (TESSALON ) 100 MG capsule     Sore throat       Relevant Medications    nirmatrelvir /ritonavir  (PAXLOVID ) 20 x 150 MG & 10 x 100MG  TABS   Other Relevant Orders   POC Covid19/Flu A&B Antigen (Completed)   POCT rapid strep A (Completed)     Acute cough       Relevant Medications   benzonatate  (TESSALON ) 100 MG capsule        Assessment and Plan Assessment & Plan COVID-19 infection Confirmed by positive test. Symptoms began on Sunday. No fever or shortness of breath. Lungs are clear. Normal GFR, allowing for Paxlovid  treatment. Discussed potential side effect of diarrhea with Paxlovid  and importance of hydration. Advised on masking around husband for five days to prevent transmission. Discussed potential for lingering cough post-treatment. Anticipated improvement within 24 to 48 hours of starting Paxlovid . - Prescribed Paxlovid , to be taken twice daily for five days. - Advised to stay hydrated, especially with Paxlovid . - Recommended wearing a mask around husband for five days. - Suggested husband take vitamin C, vitamin D , and zinc to boost immune system. - Advised to monitor husband for symptoms and seek appointment if symptoms develop.  Acute cough Discussed use of allergy medications and Mucinex to manage symptoms. Advised to avoid dairy to prevent mucus thickening. Cough medicine provided to alleviate tickling sensation but not to stop cough. - Recommended taking Zyrtec or Claritin to dry up sinuses. - Advised taking Kyleena nasal steroid to open nasal passages. - Recommended taking plain Mucinex twice daily to help with mucus. - Advised avoiding dairy to prevent mucus thickening. - Provided cough medicine to alleviate tickling sensation.        Follow up plan: Return if symptoms worsen or fail to improve. "

## 2024-03-07 NOTE — Patient Instructions (Signed)
 Recommend taking zyrtec, flonase , mucinex, vitamin c, d and zinc.  Push fluids and get rest.

## 2024-05-30 ENCOUNTER — Ambulatory Visit

## 2024-06-04 ENCOUNTER — Ambulatory Visit: Admitting: Family Medicine
